# Patient Record
Sex: Male | Born: 1935 | Race: White | Hispanic: No | Marital: Married | Smoking: Former smoker
Health system: Southern US, Community
[De-identification: ages and names within clinical notes are randomized; demographics above are authoritative.]

## PROBLEM LIST (undated history)

## (undated) DIAGNOSIS — R1310 Dysphagia, unspecified: Secondary | ICD-10-CM

## (undated) DIAGNOSIS — J189 Pneumonia, unspecified organism: Secondary | ICD-10-CM

## (undated) DIAGNOSIS — R159 Full incontinence of feces: Secondary | ICD-10-CM

## (undated) DIAGNOSIS — E785 Hyperlipidemia, unspecified: Secondary | ICD-10-CM

## (undated) DIAGNOSIS — E119 Type 2 diabetes mellitus without complications: Secondary | ICD-10-CM

## (undated) DIAGNOSIS — K2270 Barrett's esophagus without dysplasia: Secondary | ICD-10-CM

## (undated) DIAGNOSIS — Z960 Presence of urogenital implants: Secondary | ICD-10-CM

## (undated) DIAGNOSIS — N189 Chronic kidney disease, unspecified: Secondary | ICD-10-CM

## (undated) DIAGNOSIS — Z978 Presence of other specified devices: Secondary | ICD-10-CM

## (undated) DIAGNOSIS — R339 Retention of urine, unspecified: Secondary | ICD-10-CM

## (undated) DIAGNOSIS — K219 Gastro-esophageal reflux disease without esophagitis: Secondary | ICD-10-CM

## (undated) DIAGNOSIS — I219 Acute myocardial infarction, unspecified: Secondary | ICD-10-CM

## (undated) DIAGNOSIS — I2510 Atherosclerotic heart disease of native coronary artery without angina pectoris: Secondary | ICD-10-CM

## (undated) HISTORY — PX: CORONARY ARTERY BYPASS GRAFT: SHX141

## (undated) HISTORY — DX: Hyperlipidemia, unspecified: E78.5

## (undated) HISTORY — PX: OTHER SURGICAL HISTORY: SHX169

## (undated) HISTORY — PX: BACK SURGERY: SHX140

## (undated) HISTORY — DX: Dysphagia, unspecified: R13.10

## (undated) HISTORY — DX: Barrett's esophagus without dysplasia: K22.70

## (undated) HISTORY — DX: Full incontinence of feces: R15.9

## (undated) HISTORY — PX: ROTATOR CUFF REPAIR: SHX139

---

## 1994-12-26 HISTORY — PX: CORONARY ARTERY BYPASS GRAFT: SHX141

## 2002-02-05 ENCOUNTER — Ambulatory Visit (HOSPITAL_COMMUNITY): Admission: RE | Admit: 2002-02-05 | Discharge: 2002-02-06 | Payer: Self-pay | Admitting: Internal Medicine

## 2002-02-06 ENCOUNTER — Encounter: Payer: Self-pay | Admitting: Internal Medicine

## 2005-10-18 ENCOUNTER — Ambulatory Visit (HOSPITAL_COMMUNITY): Admission: RE | Admit: 2005-10-18 | Discharge: 2005-10-18 | Payer: Self-pay | Admitting: Internal Medicine

## 2006-09-13 ENCOUNTER — Ambulatory Visit (HOSPITAL_COMMUNITY): Admission: RE | Admit: 2006-09-13 | Discharge: 2006-09-13 | Payer: Self-pay | Admitting: Neurosurgery

## 2006-10-19 ENCOUNTER — Inpatient Hospital Stay (HOSPITAL_COMMUNITY): Admission: RE | Admit: 2006-10-19 | Discharge: 2006-10-21 | Payer: Self-pay | Admitting: Neurosurgery

## 2009-04-02 ENCOUNTER — Encounter (HOSPITAL_COMMUNITY): Admission: RE | Admit: 2009-04-02 | Discharge: 2009-05-03 | Payer: Self-pay | Admitting: Internal Medicine

## 2010-11-29 ENCOUNTER — Ambulatory Visit (HOSPITAL_COMMUNITY)
Admission: RE | Admit: 2010-11-29 | Discharge: 2010-11-29 | Payer: Self-pay | Source: Home / Self Care | Admitting: Internal Medicine

## 2010-11-29 ENCOUNTER — Ambulatory Visit: Payer: Self-pay | Admitting: Internal Medicine

## 2011-05-10 NOTE — Procedures (Signed)
NAMEJERIKO, [name]             ACCOUNT NO.:  [id number]   MEDICAL RECORD NO.:  [id number]           PATIENT TYPE:  REC   LOCATION:  RAD                           FACILITY:  [facility]   PHYSICIAN:  Kingsley Callander. Ouida Sills, MD       DATE OF BIRTH:  July 05, 1934   DATE OF PROCEDURE:  DATE OF DISCHARGE:                                  STRESS TEST   [name] underwent a Myoview stress test for evaluation of chest  pain, has a history of prior bypass surgery.  He exercised 9 minutes  (completing stage III of the Bruce protocol) attaining a maximal heart  rate of 136 (93% of the age predicted maximal heart rate) and a workload  of 10.1 METS and discontinued exercise due to leg fatigue.  He had some  chest tightness at peak exercise in the early recovery.  He had PVCs and  bigeminal PVCs in early recovery.  He had one ventricular couplet and  late exercise.  His baseline His EKG interpretation therefore is limited  by his baseline right bundle-branch block.   IMPRESSION:  1. No definite evidence of exercise-induced ischemia, although      interpretation is limited by an underlying right bundle-branch      block.  Myoview images are pending.  2. Chest tightness with exercise.      Kingsley Callander. Ouida Sills, MD  Electronically Signed     ROF/MEDQ  D:  04/02/2009  T:  04/02/2009  Job:  [id number]

## 2011-05-13 NOTE — Op Note (Signed)
[name], [name]             ACCOUNT NO.:  [id number]   MEDICAL RECORD NO.:  [id number]          PATIENT TYPE:  INP   LOCATION:  3029                         FACILITY:  [facility]   PHYSICIAN:  Clydene Fake, M.D.  DATE OF BIRTH:  25-Apr-1934   DATE OF PROCEDURE:  10/19/2006  DATE OF DISCHARGE:                                 OPERATIVE REPORT   DIAGNOSIS:  Lumbar stenosis.   POSTOPERATIVE DIAGNOSIS:  Lumbar stenosis.   PROCEDURE:  Left L4-5 decompressive laminectomy and foraminotomies,  microdissection with the microscope.   SURGEON:  Clydene Fake, M.D.   ASSISTANT:  Coletta Memos, M.D.   ANESTHESIA:  General endotracheal tube anesthesia.   ESTIMATED BLOOD LOSS:  Minimal; blood given, none.   DRAINS:  None.   COMPLICATIONS:  None.   REASON FOR PROCEDURE:  The patient is a 77 year old gentleman, who has had  left leg pain and numbness.  He has lumbar stenosis, and there is a  possibility of a small far-lateral fragment compressing the nerve root.  The  patient consented for a decompression   PROCEDURE IN DETAIL:  The patient was brought to the operating room.  General anesthesia was induced.  The patient was placed in the prone  position on Wilson frame with all pressure points padded.  The patient the  wound prepped and draped in a sterile fashion.  The site of incision was  injected with 10 cc 1% lidocaine with epinephrine.  A needle was then placed  in the interspace.  X-ray was obtained, showing the needle was pointing at  the 5 spinous process.  Incision  was then made slightly above where the  needle was placed and the incision taken down to the fascia and hemostasis  obtained with Bovie cauterization.  The fascia was incised over the L4 and 5  spinous processes and subperiosteal dissection was done over the L4 and 5  spinous processes and laminae out to the facets.  Markers were placed at the  interspace.  X-rays were obtained, confirming our position at 4-5,  and self-  retaining retractors were placed.  The microscope was brought in for  microdissection at this point.  We proceeded with our procedure with  microdissection with the microscope.  A high-speed drill was used to start  the decompressive laminectomy on the left side, removing the 4 lamina,  medial facetectomy, bottom part of 5.  This was completed with the Kerrison  punches.  Ligamentum flavum was removed.  The Kerrison punches and curets  were then used to continue removing hypertrophic ligament cephalad and  caudal from our laminectomy opening, also marked a little bit on the  contralateral side, trying to scrape some ligament off, decompress the canal  and the lateral gutter on the left was explored and we did find hypertrophic  ligament and facet.  We drilled a little more laterally and used Kerrison  punches to extend the laminectomy and facetectomy a little bit more  laterally, opening up the canal better.  We explored the epidural space and  found to be a disk bulge, but no significant compression  or significant disk  herniation.  The 5 root was decompressed.  We began and started exploring  the 4 root and went just a little more lateral and did our decompression  here so we could see just above the disk space.  We used various size nerve  hooks and coronary dilators the open the disk space out just above the disk  space, and follow the 4 root out.  We could feel up to the pedicle of 4 and  the pedicle of 5, and get out.  We could not find any fragment.  We  considered doing an extraforaminal approach, but decided against it due to  our fairly lateral exposure, just from the medial side.  We could not find  any compression of the 4 roots.  Hemostasis was obtained with Gelfoam and  thrombin.  We irrigated that out.  We again checked the nerve roots.  They  were all well decompressed, along with the central canal and the 4 and 5  roots.  Exploring the dura, there was a small  weakened area of the dura,  though no CSF leak was seen.  No CSF was seen throughout the case.  It was  decided to coat this with Tisseel tissue glue, and we did this.  Retractors  were removed.  Following was closed with 0 Vicryl interrupted suture.  Subcutaneous tissue was closed with 0, 2-0 and 3-0 interrupted sutures.  The  skin was closed with benzoin and Steri-Strips.  A dressing was placed.  The  patient was placed back in supine position, awakened from anesthesia, and  returned to the recovery room in stable condition.           ______________________________  Clydene Fake, M.D.     JRH/MEDQ  D:  10/19/2006  T:  10/20/2006  Job:  [id number]

## 2011-05-13 NOTE — Procedures (Signed)
[facility]  Patient:    [name], HIPPE Visit Number: [id number] MRN: [id number]          Service Type: OUT Location: RAD Attending Physician:  Carylon Perches Dictated by:   Carylon Perches, M.D. Admit Date:  02/05/2002                                Stress Test  Mr. Caffrey exercised 10 minutes 15 seconds (1 minute 15 seconds into stage IV of the Bruce protocol) obtaining a maximal heart rate of 183 (119% of the age predicted maximal heart rate) at a work load of 12.9 METS and discontinued exercise due to fatigue.  There were no symptoms of chest pain.  There were ventricular and atrial premature complexes.  There were no ST segment changes diagnostic of ischemia.  EKG interpretation was limited due to a right bundle branch block.  His baseline EKG revealed normal sinus rhythm at 57 beats per minute with right bundle branch block pattern.  IMPRESSION:  No evidence of exercise induced ischemia, although EKG interpretation is limited due to his baseline EKG changes.  Cardiolite images are pending. Dictated by:   Carylon Perches, M.D. Attending Physician:  Carylon Perches DD:  02/05/02 TD:  02/05/02 Job: [id number]

## 2011-05-18 ENCOUNTER — Other Ambulatory Visit (HOSPITAL_COMMUNITY): Payer: Self-pay | Admitting: Internal Medicine

## 2011-05-18 DIAGNOSIS — J329 Chronic sinusitis, unspecified: Secondary | ICD-10-CM

## 2011-05-20 ENCOUNTER — Ambulatory Visit (HOSPITAL_COMMUNITY)
Admission: RE | Admit: 2011-05-20 | Discharge: 2011-05-20 | Disposition: A | Discharge status: Home / Self Care | Payer: Medicare Other | Source: Ambulatory Visit | Attending: Internal Medicine | Admitting: Internal Medicine

## 2011-05-20 DIAGNOSIS — J329 Chronic sinusitis, unspecified: Secondary | ICD-10-CM | POA: Insufficient documentation

## 2011-05-20 DIAGNOSIS — J342 Deviated nasal septum: Secondary | ICD-10-CM | POA: Insufficient documentation

## 2011-05-20 DIAGNOSIS — J3489 Other specified disorders of nose and nasal sinuses: Secondary | ICD-10-CM | POA: Insufficient documentation

## 2011-09-22 ENCOUNTER — Ambulatory Visit (INDEPENDENT_AMBULATORY_CARE_PROVIDER_SITE_OTHER): Payer: Medicare Other | Admitting: Otolaryngology

## 2011-09-22 DIAGNOSIS — J310 Chronic rhinitis: Secondary | ICD-10-CM

## 2011-09-22 DIAGNOSIS — J342 Deviated nasal septum: Secondary | ICD-10-CM

## 2011-09-27 ENCOUNTER — Ambulatory Visit (INDEPENDENT_AMBULATORY_CARE_PROVIDER_SITE_OTHER): Payer: Medicare Other | Admitting: Internal Medicine

## 2011-09-27 ENCOUNTER — Encounter (INDEPENDENT_AMBULATORY_CARE_PROVIDER_SITE_OTHER): Payer: Self-pay | Admitting: Internal Medicine

## 2011-09-27 ENCOUNTER — Encounter (INDEPENDENT_AMBULATORY_CARE_PROVIDER_SITE_OTHER): Payer: Self-pay

## 2011-09-27 VITALS — BP 104/60 | HR 60 | Temp 97.7°F | Ht 70.0 in | Wt 162.2 lb

## 2011-09-27 DIAGNOSIS — R159 Full incontinence of feces: Secondary | ICD-10-CM

## 2011-09-27 NOTE — Progress Notes (Signed)
Subjective:     Patient ID: [name], male   DOB: 22-Jul-1934, 77 y.o.   MRN: [id number]  HPI  [name] is a 77 yr old male presenting today with change in his bowel habits. He says when he has a bowel movement, he says he has to keep cleaning himself.  He says he has fecal leakage and doesn't even know it.  This will go for on for about a day.  Then he will go for a couple of days and not have a BM.  Then the cycle starts again.  He has not had any symptoms in a couple of days. He will have 2-3 stools a day with these episodes. Most of the time his stools are firm. This has been going on for a couple of years. He underwent a colonoscopy in June of 2011: Normal colonoscopy except for a few diverticula at the sigmoid colon.  He also underwent an EGD at the same time. Biopsy revealed Barrett's esophagus. No dysplasia or malignancy identified.  Review of Systems see hpi Current Outpatient Prescriptions  Medication Sig Dispense Refill  . aspirin 325 MG EC tablet Take 325 mg by mouth daily.        Marland Kitchen atenolol (TENORMIN) 25 MG tablet Take 25 mg by mouth daily.        Marland Kitchen atorvastatin (LIPITOR) 40 MG tablet Take 40 mg by mouth daily.        Marland Kitchen ezetimibe (ZETIA) 10 MG tablet Take 10 mg by mouth daily.        Marland Kitchen omeprazole (PRILOSEC) 20 MG capsule Take 20 mg by mouth daily.         Past Medical History  Diagnosis Date  . Hyperlipidemia    History   Social History Narrative  . No narrative on file   History   Social History  . Marital Status: Married    Spouse Name: N/A    Number of Children: N/A  . Years of Education: N/A   Occupational History  . Not on file.   Social History Main Topics  . Smoking status: Never Smoker   . Smokeless tobacco: Not on file  . Alcohol Use: No  . Drug Use: No  . Sexually Active: Not on file   Other Topics Concern  . Not on file   Social History Narrative  . No narrative on file   History reviewed. No pertinent family history. Family Status    Relation Status Death Age  . Mother Deceased     natural 43  . Father Deceased     MI  . Brother Alive     good health  . Child Alive     both in good health. He is married. Retired self employed   Allergies no known allergies      Objective:   Physical Exam Filed Vitals:   09/27/11 1408  BP: 104/60  Pulse: 60  Temp: 97.7 F (36.5 C)  Height: 5\' 10"  (1.778 m)  Weight: 162 lb 3.2 oz (73.573 kg)    Alert and oriented. Skin warm and dry. Oral mucosa is moist. Natural teeth in good condition. Sclera anicteric, conjunctivae is pink. Thyroid not enlarged. No cervical lymphadenopathy. Lungs clear. Heart regular rate and rhythm.  Abdomen is soft. Bowel sounds are positive. No hepatomegaly. No abdominal masses felt. No tenderness. Anal sphincter feels normal.  No edema to lower extremities. Patient is alert and oriented.      Assessment:    Change in  bowel movements.    I discussed this case with Dr. Karilyn Cota.  His colonoscopy was normal.  Plan:    Fiber 3-4 ms daily.   Will schedule a anorectal manometry at [facility].  OV 3 mos.  PR in 2 weeks.

## 2011-11-03 ENCOUNTER — Ambulatory Visit (INDEPENDENT_AMBULATORY_CARE_PROVIDER_SITE_OTHER): Payer: Medicare Other | Admitting: Otolaryngology

## 2011-11-03 DIAGNOSIS — J310 Chronic rhinitis: Secondary | ICD-10-CM

## 2012-02-13 ENCOUNTER — Ambulatory Visit (INDEPENDENT_AMBULATORY_CARE_PROVIDER_SITE_OTHER): Payer: Medicare Other | Admitting: Internal Medicine

## 2012-02-13 ENCOUNTER — Encounter (INDEPENDENT_AMBULATORY_CARE_PROVIDER_SITE_OTHER): Payer: Self-pay | Admitting: Internal Medicine

## 2012-02-13 VITALS — BP 110/64 | HR 70 | Temp 97.0°F | Ht 70.0 in | Wt 158.1 lb

## 2012-02-13 DIAGNOSIS — R159 Full incontinence of feces: Secondary | ICD-10-CM | POA: Insufficient documentation

## 2012-02-13 DIAGNOSIS — E785 Hyperlipidemia, unspecified: Secondary | ICD-10-CM

## 2012-02-13 DIAGNOSIS — I2510 Atherosclerotic heart disease of native coronary artery without angina pectoris: Secondary | ICD-10-CM

## 2012-02-13 NOTE — Patient Instructions (Signed)
Continue fiber supplements. OV as needed

## 2012-02-13 NOTE — Progress Notes (Signed)
Subjective:     Patient ID: [name], male   DOB: 02-06-1934, 78 y.o.   MRN: [id number]  [name] is a 78 yr old male here today for f/u.  He was last seen October for fecal incontinence.  He was referred to [facility] for a anorectal manometry which revealed no major problems on exam.  He tells me today he is doing good.  He says his BMs are better.  His bowel movements are firmer. He has a stool diary with him.  He is taking 4 gm of Fiber daily.  He says he feels better.  Appetite is good. No weight loss. No melena or bright red rectal bleeding.   Review of Systems see hpi     Current Outpatient Prescriptions  Medication Sig Dispense Refill  . aspirin 325 MG EC tablet Take 325 mg by mouth daily.        Marland Kitchen atenolol (TENORMIN) 25 MG tablet Take 25 mg by mouth daily.        Marland Kitchen atorvastatin (LIPITOR) 40 MG tablet Take 40 mg by mouth daily.        Marland Kitchen ezetimibe (ZETIA) 10 MG tablet Take 10 mg by mouth daily.        Marland Kitchen omeprazole (PRILOSEC) 20 MG capsule Take 20 mg by mouth daily.         Past Medical History  Diagnosis Date  . Hyperlipidemia   . Fecal incontinence      History reviewed, patient examined, no change in status, stable for surgery. History   Social History  . Marital Status: Married    Spouse Name: N/A    Number of Children: N/A  . Years of Education: N/A   Occupational History  . Not on file.   Social History Main Topics  . Smoking status: Never Smoker   . Smokeless tobacco: Not on file  . Alcohol Use: No  . Drug Use: No  . Sexually Active: Not on file   Other Topics Concern  . Not on file   Social History Narrative  . No narrative on file   Family Status  Relation Status Death Age  . Mother Deceased     natural 11  . Father Deceased     MI  . Brother Alive     good health  . Child Alive     both in good health. He is married. Retired self employed    Objective:   Physical Exam  Filed Vitals:   02/13/12 1036  Height: 5\' 10"  (1.778 m)    Weight: 158 lb 1.6 oz (71.714 kg)    Alert and oriented. Skin warm and dry. Oral mucosa is moist.   . Sclera anicteric, conjunctivae is pink. Thyroid not enlarged. No cervical lymphadenopathy. Lungs clear. Heart regular rate and rhythm.  Abdomen is soft. Bowel sounds are positive. No hepatomegaly. No abdominal masses felt. No tenderness.  No edema to lower extremities. Patient is alert and oriented.      Assessment:  Fecal Incontinence which has now resolved with Fiber intake.   Plan:    f/u prn basis.

## 2014-04-14 ENCOUNTER — Other Ambulatory Visit (HOSPITAL_COMMUNITY): Payer: Self-pay | Admitting: Internal Medicine

## 2014-04-14 ENCOUNTER — Ambulatory Visit (HOSPITAL_COMMUNITY)
Admission: RE | Admit: 2014-04-14 | Discharge: 2014-04-14 | Disposition: A | Discharge status: Home / Self Care | Payer: Medicare Other | Source: Ambulatory Visit | Attending: Internal Medicine | Admitting: Internal Medicine

## 2014-04-14 DIAGNOSIS — R059 Cough, unspecified: Secondary | ICD-10-CM | POA: Insufficient documentation

## 2014-04-14 DIAGNOSIS — J449 Chronic obstructive pulmonary disease, unspecified: Secondary | ICD-10-CM | POA: Insufficient documentation

## 2014-04-14 DIAGNOSIS — R509 Fever, unspecified: Secondary | ICD-10-CM | POA: Insufficient documentation

## 2014-04-14 DIAGNOSIS — J4489 Other specified chronic obstructive pulmonary disease: Secondary | ICD-10-CM | POA: Insufficient documentation

## 2014-04-14 DIAGNOSIS — R05 Cough: Secondary | ICD-10-CM

## 2014-04-14 DIAGNOSIS — R0989 Other specified symptoms and signs involving the circulatory and respiratory systems: Secondary | ICD-10-CM | POA: Insufficient documentation

## 2015-03-04 DIAGNOSIS — T1501XA Foreign body in cornea, right eye, initial encounter: Secondary | ICD-10-CM | POA: Diagnosis not present

## 2015-04-21 DIAGNOSIS — D1801 Hemangioma of skin and subcutaneous tissue: Secondary | ICD-10-CM | POA: Diagnosis not present

## 2015-04-21 DIAGNOSIS — L570 Actinic keratosis: Secondary | ICD-10-CM | POA: Diagnosis not present

## 2015-04-21 DIAGNOSIS — D485 Neoplasm of uncertain behavior of skin: Secondary | ICD-10-CM | POA: Diagnosis not present

## 2015-04-21 DIAGNOSIS — L821 Other seborrheic keratosis: Secondary | ICD-10-CM | POA: Diagnosis not present

## 2015-06-10 DIAGNOSIS — K219 Gastro-esophageal reflux disease without esophagitis: Secondary | ICD-10-CM | POA: Diagnosis not present

## 2015-06-10 DIAGNOSIS — I2510 Atherosclerotic heart disease of native coronary artery without angina pectoris: Secondary | ICD-10-CM | POA: Diagnosis not present

## 2015-06-10 DIAGNOSIS — E119 Type 2 diabetes mellitus without complications: Secondary | ICD-10-CM | POA: Diagnosis not present

## 2015-06-10 DIAGNOSIS — Z79899 Other long term (current) drug therapy: Secondary | ICD-10-CM | POA: Diagnosis not present

## 2015-06-10 DIAGNOSIS — E785 Hyperlipidemia, unspecified: Secondary | ICD-10-CM | POA: Diagnosis not present

## 2015-06-18 DIAGNOSIS — R7301 Impaired fasting glucose: Secondary | ICD-10-CM | POA: Diagnosis not present

## 2015-06-18 DIAGNOSIS — I2510 Atherosclerotic heart disease of native coronary artery without angina pectoris: Secondary | ICD-10-CM | POA: Diagnosis not present

## 2015-06-18 DIAGNOSIS — E785 Hyperlipidemia, unspecified: Secondary | ICD-10-CM | POA: Diagnosis not present

## 2015-10-02 DIAGNOSIS — Z6823 Body mass index (BMI) 23.0-23.9, adult: Secondary | ICD-10-CM | POA: Diagnosis not present

## 2015-10-02 DIAGNOSIS — R05 Cough: Secondary | ICD-10-CM | POA: Diagnosis not present

## 2015-10-15 DIAGNOSIS — Z23 Encounter for immunization: Secondary | ICD-10-CM | POA: Diagnosis not present

## 2015-12-01 ENCOUNTER — Other Ambulatory Visit (HOSPITAL_COMMUNITY): Payer: Self-pay | Admitting: Internal Medicine

## 2015-12-01 DIAGNOSIS — I70213 Atherosclerosis of native arteries of extremities with intermittent claudication, bilateral legs: Secondary | ICD-10-CM | POA: Diagnosis not present

## 2015-12-01 DIAGNOSIS — Z6824 Body mass index (BMI) 24.0-24.9, adult: Secondary | ICD-10-CM | POA: Diagnosis not present

## 2015-12-04 ENCOUNTER — Ambulatory Visit (HOSPITAL_COMMUNITY)
Admission: RE | Admit: 2015-12-04 | Discharge: 2015-12-04 | Disposition: A | Discharge status: Home / Self Care | Payer: Medicare Other | Source: Ambulatory Visit | Attending: Internal Medicine | Admitting: Internal Medicine

## 2015-12-04 DIAGNOSIS — N400 Enlarged prostate without lower urinary tract symptoms: Secondary | ICD-10-CM | POA: Diagnosis not present

## 2015-12-04 DIAGNOSIS — I708 Atherosclerosis of other arteries: Secondary | ICD-10-CM | POA: Diagnosis not present

## 2015-12-04 DIAGNOSIS — I70213 Atherosclerosis of native arteries of extremities with intermittent claudication, bilateral legs: Secondary | ICD-10-CM

## 2015-12-04 DIAGNOSIS — I77811 Abdominal aortic ectasia: Secondary | ICD-10-CM | POA: Insufficient documentation

## 2015-12-04 DIAGNOSIS — I700 Atherosclerosis of aorta: Secondary | ICD-10-CM | POA: Diagnosis not present

## 2015-12-11 DIAGNOSIS — E119 Type 2 diabetes mellitus without complications: Secondary | ICD-10-CM | POA: Diagnosis not present

## 2015-12-18 DIAGNOSIS — R7301 Impaired fasting glucose: Secondary | ICD-10-CM | POA: Diagnosis not present

## 2015-12-18 DIAGNOSIS — I700 Atherosclerosis of aorta: Secondary | ICD-10-CM | POA: Diagnosis not present

## 2016-01-07 DIAGNOSIS — H26492 Other secondary cataract, left eye: Secondary | ICD-10-CM | POA: Diagnosis not present

## 2016-01-07 DIAGNOSIS — E119 Type 2 diabetes mellitus without complications: Secondary | ICD-10-CM | POA: Diagnosis not present

## 2016-01-07 DIAGNOSIS — H52203 Unspecified astigmatism, bilateral: Secondary | ICD-10-CM | POA: Diagnosis not present

## 2016-01-07 DIAGNOSIS — Z961 Presence of intraocular lens: Secondary | ICD-10-CM | POA: Diagnosis not present

## 2016-06-13 DIAGNOSIS — I2510 Atherosclerotic heart disease of native coronary artery without angina pectoris: Secondary | ICD-10-CM | POA: Diagnosis not present

## 2016-06-13 DIAGNOSIS — K219 Gastro-esophageal reflux disease without esophagitis: Secondary | ICD-10-CM | POA: Diagnosis not present

## 2016-06-13 DIAGNOSIS — R7301 Impaired fasting glucose: Secondary | ICD-10-CM | POA: Diagnosis not present

## 2016-06-13 DIAGNOSIS — E785 Hyperlipidemia, unspecified: Secondary | ICD-10-CM | POA: Diagnosis not present

## 2016-06-13 DIAGNOSIS — Z79899 Other long term (current) drug therapy: Secondary | ICD-10-CM | POA: Diagnosis not present

## 2016-06-20 DIAGNOSIS — I2510 Atherosclerotic heart disease of native coronary artery without angina pectoris: Secondary | ICD-10-CM | POA: Diagnosis not present

## 2016-06-20 DIAGNOSIS — E785 Hyperlipidemia, unspecified: Secondary | ICD-10-CM | POA: Diagnosis not present

## 2016-06-20 DIAGNOSIS — E119 Type 2 diabetes mellitus without complications: Secondary | ICD-10-CM | POA: Diagnosis not present

## 2016-06-24 DIAGNOSIS — L570 Actinic keratosis: Secondary | ICD-10-CM | POA: Diagnosis not present

## 2016-06-24 DIAGNOSIS — L821 Other seborrheic keratosis: Secondary | ICD-10-CM | POA: Diagnosis not present

## 2016-06-24 DIAGNOSIS — L720 Epidermal cyst: Secondary | ICD-10-CM | POA: Diagnosis not present

## 2016-06-24 DIAGNOSIS — D692 Other nonthrombocytopenic purpura: Secondary | ICD-10-CM | POA: Diagnosis not present

## 2016-09-14 DIAGNOSIS — Z79899 Other long term (current) drug therapy: Secondary | ICD-10-CM | POA: Diagnosis not present

## 2016-09-14 DIAGNOSIS — E785 Hyperlipidemia, unspecified: Secondary | ICD-10-CM | POA: Diagnosis not present

## 2016-09-14 DIAGNOSIS — I2510 Atherosclerotic heart disease of native coronary artery without angina pectoris: Secondary | ICD-10-CM | POA: Diagnosis not present

## 2016-09-21 DIAGNOSIS — J069 Acute upper respiratory infection, unspecified: Secondary | ICD-10-CM | POA: Diagnosis not present

## 2016-09-21 DIAGNOSIS — E785 Hyperlipidemia, unspecified: Secondary | ICD-10-CM | POA: Diagnosis not present

## 2016-09-21 DIAGNOSIS — Z23 Encounter for immunization: Secondary | ICD-10-CM | POA: Diagnosis not present

## 2016-12-22 ENCOUNTER — Encounter (INDEPENDENT_AMBULATORY_CARE_PROVIDER_SITE_OTHER): Payer: Self-pay | Admitting: Internal Medicine

## 2016-12-22 ENCOUNTER — Encounter (INDEPENDENT_AMBULATORY_CARE_PROVIDER_SITE_OTHER): Payer: Self-pay

## 2017-01-09 DIAGNOSIS — H01001 Unspecified blepharitis right upper eyelid: Secondary | ICD-10-CM | POA: Diagnosis not present

## 2017-01-09 DIAGNOSIS — H16102 Unspecified superficial keratitis, left eye: Secondary | ICD-10-CM | POA: Diagnosis not present

## 2017-01-09 DIAGNOSIS — E119 Type 2 diabetes mellitus without complications: Secondary | ICD-10-CM | POA: Diagnosis not present

## 2017-01-09 DIAGNOSIS — H52203 Unspecified astigmatism, bilateral: Secondary | ICD-10-CM | POA: Diagnosis not present

## 2017-01-12 ENCOUNTER — Ambulatory Visit (INDEPENDENT_AMBULATORY_CARE_PROVIDER_SITE_OTHER): Payer: Medicare Other | Admitting: Internal Medicine

## 2017-01-23 DIAGNOSIS — Z79899 Other long term (current) drug therapy: Secondary | ICD-10-CM | POA: Diagnosis not present

## 2017-01-23 DIAGNOSIS — E119 Type 2 diabetes mellitus without complications: Secondary | ICD-10-CM | POA: Diagnosis not present

## 2017-01-25 ENCOUNTER — Encounter (INDEPENDENT_AMBULATORY_CARE_PROVIDER_SITE_OTHER): Payer: Self-pay | Admitting: *Deleted

## 2017-01-25 ENCOUNTER — Encounter (INDEPENDENT_AMBULATORY_CARE_PROVIDER_SITE_OTHER): Payer: Self-pay | Admitting: Internal Medicine

## 2017-01-25 ENCOUNTER — Other Ambulatory Visit (INDEPENDENT_AMBULATORY_CARE_PROVIDER_SITE_OTHER): Payer: Self-pay | Admitting: Internal Medicine

## 2017-01-25 ENCOUNTER — Ambulatory Visit (INDEPENDENT_AMBULATORY_CARE_PROVIDER_SITE_OTHER): Payer: Medicare HMO | Admitting: Internal Medicine

## 2017-01-25 VITALS — BP 124/70 | HR 60 | Temp 97.7°F | Ht 70.0 in | Wt 161.5 lb

## 2017-01-25 DIAGNOSIS — R1319 Other dysphagia: Secondary | ICD-10-CM

## 2017-01-25 DIAGNOSIS — K22710 Barrett's esophagus with low grade dysplasia: Secondary | ICD-10-CM | POA: Insufficient documentation

## 2017-01-25 DIAGNOSIS — R1310 Dysphagia, unspecified: Secondary | ICD-10-CM

## 2017-01-25 DIAGNOSIS — K2270 Barrett's esophagus without dysplasia: Secondary | ICD-10-CM

## 2017-01-25 HISTORY — DX: Dysphagia, unspecified: R13.10

## 2017-01-25 HISTORY — DX: Barrett's esophagus without dysplasia: K22.70

## 2017-01-25 NOTE — Patient Instructions (Signed)
EGD/ED. The risks and benefits such as perforation, bleeding, and infection were reviewed with the patient and is agreeable. 

## 2017-01-25 NOTE — Progress Notes (Signed)
   Subjective:    Patient ID: [name], male    DOB: [birth year], 83 y.o.   MRN: [id number]   HPI Presents today with c/o dysphagia. He says foods will not go down.  He cannot eat meat or breads. He can eat toast.  He says foods are lodging in his upper esophagus. Symptoms for a month or more. Acid reflux controlled with Prilosec. He has a BM daily. No melena or BRRB.    Last EGD/Colonosopy in 2011. A 4cm sized sliding hiatal hernia. Small patches of pink or salmon-colored mucosa which were biopsied. Normal exam of stomach,1st and 2nd part of duodenum. Normal colonoscopy except for 2 diverticula at sigmoid colon.  Biopsy: Barrett's esophagus with low grade dysplasia. No neoplasm identified.     CABG over 20 yrs ago.   Review of Systems  Past Medical History:  Diagnosis Date  . Barrett's esophagus 01/25/2017  . Dysphagia 01/25/2017  . Fecal incontinence   . Hyperlipidemia     Past Surgical History:  Procedure Laterality Date  . BACK SURGERY    . cataract surgery     bilateral  . CORONARY ARTERY BYPASS GRAFT     15 yrs ago  . ROTATOR CUFF REPAIR     Left    No Known Allergies  Current Outpatient Prescriptions on File Prior to Visit  Medication Sig Dispense Refill  . aspirin 325 MG EC tablet Take 325 mg by mouth daily.      Marland Kitchen atenolol (TENORMIN) 25 MG tablet Take 25 mg by mouth daily.      Marland Kitchen ezetimibe (ZETIA) 10 MG tablet Take 10 mg by mouth daily.      Marland Kitchen omeprazole (PRILOSEC) 20 MG capsule Take 20 mg by mouth daily.       No current facility-administered medications on file prior to visit.         Objective:   Physical Exam Blood pressure 124/70, pulse 60, temperature 97.7 F (36.5 C), height 5\' 10"  (1.778 m), weight 161 lb 8 oz (73.3 kg). Alert and oriented. Skin warm and dry. Oral mucosa is moist.   . Sclera anicteric, conjunctivae is pink. Thyroid not enlarged. No cervical lymphadenopathy. Lungs clear. Heart regular rate and rhythm.  Abdomen is soft.  Bowel sounds are positive. No hepatomegaly. No abdominal masses felt. No tenderness.  No edema to lower extremities.          Assessment & Plan:  Dysphagia. Hx of Barrett's esophagus. Needs surveillance for Barrett's. Esophageal stricture, web needs to be ruled out. EGD/ED.

## 2017-01-26 DIAGNOSIS — R69 Illness, unspecified: Secondary | ICD-10-CM | POA: Diagnosis not present

## 2017-01-31 ENCOUNTER — Encounter: Payer: Self-pay | Admitting: Internal Medicine

## 2017-01-31 DIAGNOSIS — E119 Type 2 diabetes mellitus without complications: Secondary | ICD-10-CM | POA: Diagnosis not present

## 2017-01-31 DIAGNOSIS — I2510 Atherosclerotic heart disease of native coronary artery without angina pectoris: Secondary | ICD-10-CM | POA: Diagnosis not present

## 2017-01-31 DIAGNOSIS — Z6823 Body mass index (BMI) 23.0-23.9, adult: Secondary | ICD-10-CM | POA: Diagnosis not present

## 2017-02-17 ENCOUNTER — Encounter (HOSPITAL_COMMUNITY): Payer: Self-pay | Admitting: *Deleted

## 2017-02-17 ENCOUNTER — Encounter (HOSPITAL_COMMUNITY)
Admission: RE | Disposition: A | Discharge status: Home / Self Care | Payer: Self-pay | Source: Ambulatory Visit | Attending: Internal Medicine

## 2017-02-17 ENCOUNTER — Ambulatory Visit (HOSPITAL_COMMUNITY)
Admission: RE | Admit: 2017-02-17 | Discharge: 2017-02-17 | Disposition: A | Discharge status: Home / Self Care | Payer: Medicare HMO | Source: Ambulatory Visit | Attending: Internal Medicine | Admitting: Internal Medicine

## 2017-02-17 DIAGNOSIS — R1314 Dysphagia, pharyngoesophageal phase: Secondary | ICD-10-CM | POA: Insufficient documentation

## 2017-02-17 DIAGNOSIS — Q394 Esophageal web: Secondary | ICD-10-CM | POA: Insufficient documentation

## 2017-02-17 DIAGNOSIS — R1319 Other dysphagia: Secondary | ICD-10-CM | POA: Insufficient documentation

## 2017-02-17 DIAGNOSIS — E785 Hyperlipidemia, unspecified: Secondary | ICD-10-CM | POA: Diagnosis not present

## 2017-02-17 DIAGNOSIS — R1310 Dysphagia, unspecified: Secondary | ICD-10-CM

## 2017-02-17 DIAGNOSIS — Z87891 Personal history of nicotine dependence: Secondary | ICD-10-CM | POA: Diagnosis not present

## 2017-02-17 DIAGNOSIS — K317 Polyp of stomach and duodenum: Secondary | ICD-10-CM | POA: Diagnosis not present

## 2017-02-17 DIAGNOSIS — Z7982 Long term (current) use of aspirin: Secondary | ICD-10-CM | POA: Diagnosis not present

## 2017-02-17 DIAGNOSIS — K222 Esophageal obstruction: Secondary | ICD-10-CM | POA: Insufficient documentation

## 2017-02-17 DIAGNOSIS — K2270 Barrett's esophagus without dysplasia: Secondary | ICD-10-CM | POA: Diagnosis not present

## 2017-02-17 DIAGNOSIS — Z79899 Other long term (current) drug therapy: Secondary | ICD-10-CM | POA: Diagnosis not present

## 2017-02-17 DIAGNOSIS — K219 Gastro-esophageal reflux disease without esophagitis: Secondary | ICD-10-CM | POA: Insufficient documentation

## 2017-02-17 DIAGNOSIS — K449 Diaphragmatic hernia without obstruction or gangrene: Secondary | ICD-10-CM | POA: Diagnosis not present

## 2017-02-17 DIAGNOSIS — K22710 Barrett's esophagus with low grade dysplasia: Secondary | ICD-10-CM | POA: Insufficient documentation

## 2017-02-17 HISTORY — PX: ESOPHAGOGASTRODUODENOSCOPY: SHX5428

## 2017-02-17 HISTORY — PX: ESOPHAGEAL DILATION: SHX303

## 2017-02-17 SURGERY — EGD (ESOPHAGOGASTRODUODENOSCOPY)
Anesthesia: Moderate Sedation

## 2017-02-17 MED ORDER — MEPERIDINE HCL 50 MG/ML IJ SOLN
INTRAMUSCULAR | Status: DC | PRN
  Administered 2017-02-17: 25 mg via INTRAVENOUS

## 2017-02-17 MED ORDER — STERILE WATER FOR IRRIGATION IR SOLN
Status: DC | PRN
  Administered 2017-02-17: 100 mL

## 2017-02-17 MED ORDER — SODIUM CHLORIDE 0.9 % IV SOLN
INTRAVENOUS | Status: DC
  Administered 2017-02-17: 1000 mL via INTRAVENOUS

## 2017-02-17 MED ORDER — MIDAZOLAM HCL 5 MG/5ML IJ SOLN
INTRAMUSCULAR | Status: DC | PRN
  Administered 2017-02-17: 1 mg via INTRAVENOUS
  Administered 2017-02-17: 2 mg via INTRAVENOUS
  Administered 2017-02-17: 1 mg via INTRAVENOUS

## 2017-02-17 MED ORDER — LIDOCAINE VISCOUS 2 % MT SOLN
OROMUCOSAL | Status: AC
  Filled 2017-02-17: qty 15

## 2017-02-17 MED ORDER — LIDOCAINE VISCOUS 2 % MT SOLN
OROMUCOSAL | Status: DC | PRN
  Administered 2017-02-17: 4 mL via OROMUCOSAL

## 2017-02-17 MED ORDER — MEPERIDINE HCL 50 MG/ML IJ SOLN
INTRAMUSCULAR | Status: AC
  Filled 2017-02-17: qty 1

## 2017-02-17 MED ORDER — MIDAZOLAM HCL 5 MG/5ML IJ SOLN
INTRAMUSCULAR | Status: AC
  Filled 2017-02-17: qty 10

## 2017-02-17 NOTE — Discharge Instructions (Signed)
No aspirin or NSAIDs for 3 days. Resume other medications as before. Soft foods for 24 hours. No driving for 24 hours. Patient will call with biopsy results.  Esophagogastroduodenoscopy, Care After Introduction Refer to this sheet in the next few weeks. These instructions provide you with information about caring for yourself after your procedure. Your health care provider may also give you more specific instructions. Your treatment has been planned according to current medical practices, but problems sometimes occur. Call your health care provider if you have any problems or questions after your procedure. What can I expect after the procedure? After the procedure, it is common to have:  A sore throat.  Nausea.  Bloating.  Dizziness.  Fatigue. Follow these instructions at home:  Do not eat or drink anything until the numbing medicine (local anesthetic) has worn off and your gag reflex has returned. You will know that the local anesthetic has worn off when you can swallow comfortably.  Do not drive for 24 hours if you received a medicine to help you relax (sedative).  If your health care provider took a tissue sample for testing during the procedure, make sure to get your test results. This is your responsibility. Ask your health care provider or the department performing the test when your results will be ready.  Keep all follow-up visits as told by your health care provider. This is important. Contact a health care provider if:  You cannot stop coughing.  You are not urinating.  You are urinating less than usual. Get help right away if:  You have trouble swallowing.  You cannot eat or drink.  You have throat or chest pain that gets worse.  You are dizzy or light-headed.  You faint.  You have nausea or vomiting.  You have chills.  You have a fever.  You have severe abdominal pain.  You have black, tarry, or bloody stools. This information is not intended to  replace advice given to you by your health care provider. Make sure you discuss any questions you have with your health care provider. Document Released: 11/28/2012 Document Revised: 05/19/2016 Document Reviewed: 11/05/2015  2017 Elsevier   Barrett Esophagus Introduction Barrett esophagus occurs when the tissue that lines the esophagus changes or becomes damaged. The esophagus is the tube that carries food from the throat to the stomach. With Barrett esophagus, the cells that line the esophagus are replaced by cells that are similar to the lining of the intestines (intestinal metaplasia). Barrett esophagus itself may not cause any symptoms. However, many people who have Barrett esophagus also have gastroesophageal reflux disease (GERD), which may cause symptoms such as heartburn. Treatment may include medicines, procedures to destroy the abnormal cells, or surgery. Over time, a few people with this condition may develop cancer of the esophagus. What are the causes? The exact cause of this condition is not known. In some cases, the condition develops from damage to the lining of the esophagus caused by GERD. GERD occurs when stomach acids flow up from the stomach into the esophagus. Frequent symptoms of GERD may cause intestinal metaplasia or cause cell changes (dysplasia). What increases the risk? The following factors may make you more likely to develop this condition:  Having GERD.  Being any of the following:  Male.  White (Caucasian).  Obese.  Older than 50.  Having a hiatal hernia.  Smoking. What are the signs or symptoms? People with Barrett esophagus often have no symptoms. However, many people with this condition also have  GERD. Symptoms of GERD may include:  Heartburn.  Difficulty swallowing.  Dry cough. How is this diagnosed? Barrett esophagus may be diagnosed with an exam called an upper gastrointestinal endoscopy. During this exam, a thin, flexible tube (endoscope)  is passed down your esophagus. The endoscope has a light and camera on the end of it. Your health care provider uses the endoscope to view the inside of your esophagus. During the exam, several tissue samples will be removed (biopsy) from your esophagus so they can be checked for intestinal metaplasia or dysplasia. How is this treated? Treatment for this condition may include:  Medicines (proton pump inhibitors, or PPIs) to decrease or stop GERD.  Periodic endoscopic exams to make sure that cancer is not developing.  A procedure or surgery for dysplasia. This may include:  Endoscopic removal or destruction of abnormal cells.  Removal of part of the esophagus (esophagectomy). Follow these instructions at home: Eating and drinking  Eat more fruits and vegetables.  Avoid fatty foods.  Eat small, frequent meals instead of large meals.  Avoid foods that cause heartburn. These foods include:  Coffee and alcoholic drinks.  Tomatoes and foods made with tomatoes.  Greasy or spicy foods.  Chocolate and peppermint. General instructions  Take over-the-counter and prescription medicines only as told by your health care provider.  Do not use any tobacco products, such as cigarettes, chewing tobacco, and e-cigarettes. If you need help quitting, ask your health care provider.  If your health care provider is treating you for GERD, make sure you follow all instructions and take medicines as directed.  Keep all follow-up visits as told by your health care provider. This is important. Contact a health care provider if:  You have heartburn or GERD symptoms.  You have difficulty swallowing. Get help right away if:  You have chest pain.  You are unable to swallow.  You vomit blood or material that looks like coffee grounds.  Your stool (feces) is bright red or dark. This information is not intended to replace advice given to you by your health care provider. Make sure you discuss any  questions you have with your health care provider. Document Released: 03/03/2004 Document Revised: 05/19/2016 Document Reviewed: 09/24/2015  2017 Elsevier   Hiatal Hernia A hiatal hernia occurs when part of your stomach slides above the muscle that separates your abdomen from your chest (diaphragm). You can be born with a hiatal hernia (congenital), or it may develop over time. In almost all cases of hiatal hernia, only the top part of the stomach pushes through.  Many people have a hiatal hernia with no symptoms. The larger the hernia, the more likely that you will have symptoms. In some cases, a hiatal hernia allows stomach acid to flow back into the tube that carries food from your mouth to your stomach (esophagus). This may cause heartburn symptoms. Severe heartburn symptoms may mean you have developed a condition called gastroesophageal reflux disease (GERD).  CAUSES  Hiatal hernias are caused by a weakness in the opening (hiatus) where your esophagus passes through your diaphragm to attach to the upper part of your stomach. You may be born with a weakness in your hiatus, or a weakness can develop. RISK FACTORS Older age is a major risk factor for a hiatal hernia. Anything that increases pressure on your diaphragm can also increase your risk of a hiatal hernia. This includes:  Pregnancy.  Excess weight.  Frequent constipation. SIGNS AND SYMPTOMS  People with a hiatal hernia  often have no symptoms. If symptoms develop, they are almost always caused by GERD. They may include:  Heartburn.  Belching.  Indigestion.  Trouble swallowing.  Coughing or wheezing.  Sore throat.  Hoarseness.  Chest pain. DIAGNOSIS  A hiatal hernia is sometimes found during an exam for another problem. Your health care provider may suspect a hiatal hernia if you have symptoms of GERD. Tests may be done to diagnose GERD. These may include:  X-rays of your stomach or chest.  An upper gastrointestinal  (GI) series. This is an X-ray exam of your GI tract involving the use of a chalky liquid that you swallow. The liquid shows up clearly on the X-ray.  Endoscopy. This is a procedure to look into your stomach using a thin, flexible tube that has a tiny camera and light on the end of it. TREATMENT  If you have no symptoms, you may not need treatment. If you have symptoms, treatment may include:  Dietary and lifestyle changes to help reduce GERD symptoms.  Medicines. These may include:  Over-the-counter antacids.  Medicines that make your stomach empty more quickly.  Medicines that block the production of stomach acid (H2 blockers).  Stronger medicines to reduce stomach acid (proton pump inhibitors).  You may need surgery to repair the hernia if other treatments are not helping. HOME CARE INSTRUCTIONS   Take all medicines as directed by your health care provider.  Quit smoking, if you smoke.  Try to achieve and maintain a healthy body weight.  Eat frequent small meals instead of three large meals a day. This keeps your stomach from getting too full.  Eat slowly.  Do not lie down right after eating.  Do noteat 1-2 hours before bed.   Do not drink beverages with caffeine. These include cola, coffee, cocoa, and tea.  Do not drink alcohol.  Avoid foods that can make symptoms of GERD worse. These may include:  Fatty foods.  Citrus fruits.  Other foods and drinks that contain acid.  Avoid putting pressure on your belly. Anything that puts pressure on your belly increases the amount of acid that may be pushed up into your esophagus.   Avoid bending over, especially after eating.  Raise the head of your bed by putting blocks under the legs. This keeps your head and esophagus higher than your stomach.  Do not wear tight clothing around your chest or stomach.  Try not to strain when having a bowel movement, when urinating, or when lifting heavy objects. SEEK MEDICAL CARE  IF:  Your symptoms are not controlled with medicines or lifestyle changes.  You are having trouble swallowing.  You have coughing or wheezing that will not go away. SEEK IMMEDIATE MEDICAL CARE IF:  Your pain is getting worse.  Your pain spreads to your arms, neck, jaw, teeth, or back.  You have shortness of breath.  You sweat for no reason.  You feel sick to your stomach (nauseous) or vomit.  You vomit blood.  You have bright red blood in your stools.  You have black, tarry stools.  This information is not intended to replace advice given to you by your health care provider. Make sure you discuss any questions you have with your health care provider. Document Released: 03/03/2004 Document Revised: 04/04/2016 Document Reviewed: 11/29/2013 Elsevier Interactive Patient Education  2017 [employer].

## 2017-02-17 NOTE — H&P (Signed)
[name] is an 83 y.o. male.   Chief Complaint: Patient is here for EGD and ED. HPI: Patient is 83 year old Caucasian male was chronic GERD complicated by short segment Barrett's esophagus was last EGD with biopsy was in December 2011 who presented to month history of dysphagia to solids. He has no difficulty with liquids. He points to suprasternal area site of bolus obstruction. He is able to wash food bolus down with sips of water. He says heartburn is well controlled with omeprazole. He denies nausea vomiting abdominal pain or melena. His weight has been stable.  Past Medical History:  Diagnosis Date  . Barrett's esophagus 01/25/2017  . Dysphagia 01/25/2017  . Fecal incontinence   . Hyperlipidemia     Past Surgical History:  Procedure Laterality Date  . BACK SURGERY    . cataract surgery     bilateral  . CORONARY ARTERY BYPASS GRAFT     15 yrs ago  . ROTATOR CUFF REPAIR     Left    History reviewed. No pertinent family history. Social History:  reports that he has quit smoking. He has quit using smokeless tobacco. He reports that he does not drink alcohol or use drugs.  Allergies: No Known Allergies  Medications Prior to Admission  Medication Sig Dispense Refill  . aspirin 325 MG EC tablet Take 325 mg by mouth daily.      Marland Kitchen atenolol (TENORMIN) 25 MG tablet Take 25 mg by mouth daily.      Marland Kitchen ezetimibe (ZETIA) 10 MG tablet Take 10 mg by mouth daily.      Marland Kitchen omeprazole (PRILOSEC) 20 MG capsule Take 20 mg by mouth daily.      . polycarbophil (FIBERCON) 625 MG tablet Take 1,250 mg by mouth 2 (two) times daily.     . pravastatin (PRAVACHOL) 20 MG tablet Take 20 mg by mouth at bedtime.     Marland Kitchen acetaminophen (TYLENOL) 500 MG tablet Take 500-1,000 mg by mouth daily as needed for moderate pain or headache.      No results found for this or any previous visit (from the past 48 hour(s)). No results found.  ROS  Blood pressure (!) 151/75, pulse (!) 51, temperature 97.5 F (36.4  C), temperature source Oral, resp. rate 18, height 5\' 9"  (1.753 m), weight 160 lb (72.6 kg), SpO2 99 %. Physical Exam  Constitutional: He appears well-developed and well-nourished.  HENT:  Mouth/Throat: Oropharynx is clear and moist.  Eyes: Conjunctivae are normal. No scleral icterus.  Neck: No thyromegaly present.  Cardiovascular: Normal rate, regular rhythm and normal heart sounds.   No murmur heard. Respiratory: Effort normal and breath sounds normal.  GI: Soft. He exhibits no distension and no mass. There is no tenderness.  Musculoskeletal: He exhibits no edema.  Lymphadenopathy:    He has no cervical adenopathy.  Neurological: He is alert.  Skin: Skin is warm and dry.     Assessment/Plan Solid food dysphagia in patient with chronic GERD and known short segment Barrett's esophagus. EGD with EGD and possible biopsy.  Hildred Laser, MD 02/17/2017, 12:30 PM

## 2017-02-17 NOTE — Op Note (Signed)
[facility] Patient Name: [name] Procedure Date: 02/17/2017 12:09 PM MRN: [id number] Date of Birth: [birth year] Attending MD: Hildred Laser , MD CSN: [id number] Age: 83 Admit Type: Outpatient Procedure:                Upper GI endoscopy Indications:              Esophageal dysphagia Providers:                Hildred Laser, MD, Rosina Lowenstein, RN, Aram Candela Referring MD:             Asencion Noble, MD Medicines:                viscous lidocaine , Meperidine 25 mg IV, Midazolam                            4 mg IV Complications:            No immediate complications. Estimated Blood Loss:     Estimated blood loss was minimal. Procedure:                Pre-Anesthesia Assessment:                           - Prior to the procedure, a History and Physical                            was performed, and patient medications and                            allergies were reviewed. The patient's tolerance of                            previous anesthesia was also reviewed. The risks                            and benefits of the procedure and the sedation                            options and risks were discussed with the patient.                            All questions were answered, and informed consent                            was obtained. Prior Anticoagulants: The patient                            last took aspirin 4 days prior to the procedure.                            ASA Grade Assessment: II - A patient with mild                            systemic disease. After reviewing the risks and  benefits, the patient was deemed in satisfactory                            condition to undergo the procedure.                           After obtaining informed consent, the endoscope was                            passed under direct vision. Throughout the                            procedure, the patient's blood pressure, pulse, and   oxygen saturations were monitored continuously. The                            [id number]) was introduced through the                            mouth, and advanced to the second part of duodenum.                            The upper GI endoscopy was accomplished without                            difficulty. The patient tolerated the procedure                            well. Scope In: 12:37:23 PM Scope Out: 12:47:33 PM Total Procedure Duration: 0 hours 10 minutes 10 seconds  Findings:      A web was found in the proximal esophagus.      A low-grade of narrowing Schatzki ring (acquired) was found at the       gastroesophageal junction. The scope was withdrawn. Dilation was       performed with a Maloney dilator with no resistance at 40 Fr. The       dilation site was examined following endoscope reinsertion and showed       moderate improvement in luminal narrowing.      There were esophageal mucosal changes secondary to established       short-segment Barrett's disease present in the distal esophagus. The       maximum longitudinal extent of these mucosal changes was 0.5 cm in       length. Mucosa was biopsied with a cold forceps for histology at 35 cm       from the incisors.      A 6 cm hiatal hernia was present.      A few sessile polyps were found in the gastric body.      The exam of the stomach was otherwise normal.      The duodenal bulb and second portion of the duodenum were normal. Impression:               - Web in the proximal esophagus.                           - Low-grade of narrowing Schatzki ring. Dilated.                           -  Esophageal mucosal changes secondary to                            established short-segment Barrett's disease.                            Biopsied.                           - 6 cm hiatal hernia.                           - A few gastric polyps.                           - Normal duodenal bulb and second portion of the                             duodenum. Moderate Sedation:      Moderate (conscious) sedation was administered by the endoscopy nurse       and supervised by the endoscopist. The following parameters were       monitored: oxygen saturation, heart rate, blood pressure, CO2       capnography and response to care. Total physician intraservice time was       14 minutes. Recommendation:           - Patient has a contact number available for                            emergencies. The signs and symptoms of potential                            delayed complications were discussed with the                            patient. Return to normal activities tomorrow.                            Written discharge instructions were provided to the                            patient.                           - Mechanical soft diet today.                           - Resume previous diet for 1 day.                           - Continue present medications.                           - Resume aspirin at prior dose in 3 days.                           - Await pathology results. Procedure  Code(s):        --- Professional ---                           [phone], Esophagogastroduodenoscopy, flexible,                            transoral; with biopsy, single or multiple                           43450, Dilation of esophagus, by unguided sound or                            bougie, single or multiple passes                           99152, Moderate sedation services provided by the                            same physician or other qualified health care                            professional performing the diagnostic or                            therapeutic service that the sedation supports,                            requiring the presence of an independent trained                            observer to assist in the monitoring of the                            patient's level of consciousness and physiological                             status; initial 15 minutes of intraservice time,                            patient age 72 years or older Diagnosis Code(s):        --- Professional ---                           Q39.4, Esophageal web                           K22.2, Esophageal obstruction                           K22.70, Barrett's esophagus without dysplasia                           K44.9, Diaphragmatic hernia without obstruction or  gangrene                           K31.7, Polyp of stomach and duodenum                           R13.14, Dysphagia, pharyngoesophageal phase CPT copyright 2016 American Medical Association. All rights reserved. The codes documented in this report are preliminary and upon coder review may  be revised to meet current compliance requirements. Hildred Laser, MD Hildred Laser, MD 02/17/2017 12:57:41 PM This report has been signed electronically. Number of Addenda: 0

## 2017-02-21 ENCOUNTER — Encounter (HOSPITAL_COMMUNITY): Payer: Self-pay | Admitting: Internal Medicine

## 2017-03-01 DIAGNOSIS — R69 Illness, unspecified: Secondary | ICD-10-CM | POA: Diagnosis not present

## 2017-04-24 DIAGNOSIS — R05 Cough: Secondary | ICD-10-CM | POA: Diagnosis not present

## 2017-06-06 DIAGNOSIS — J40 Bronchitis, not specified as acute or chronic: Secondary | ICD-10-CM | POA: Diagnosis not present

## 2017-07-28 DIAGNOSIS — E785 Hyperlipidemia, unspecified: Secondary | ICD-10-CM | POA: Diagnosis not present

## 2017-07-28 DIAGNOSIS — I2510 Atherosclerotic heart disease of native coronary artery without angina pectoris: Secondary | ICD-10-CM | POA: Diagnosis not present

## 2017-07-28 DIAGNOSIS — K219 Gastro-esophageal reflux disease without esophagitis: Secondary | ICD-10-CM | POA: Diagnosis not present

## 2017-07-28 DIAGNOSIS — E119 Type 2 diabetes mellitus without complications: Secondary | ICD-10-CM | POA: Diagnosis not present

## 2017-07-28 DIAGNOSIS — Z79899 Other long term (current) drug therapy: Secondary | ICD-10-CM | POA: Diagnosis not present

## 2017-08-07 DIAGNOSIS — E119 Type 2 diabetes mellitus without complications: Secondary | ICD-10-CM | POA: Diagnosis not present

## 2017-08-07 DIAGNOSIS — E785 Hyperlipidemia, unspecified: Secondary | ICD-10-CM | POA: Diagnosis not present

## 2017-09-18 DIAGNOSIS — L570 Actinic keratosis: Secondary | ICD-10-CM | POA: Diagnosis not present

## 2017-09-18 DIAGNOSIS — L821 Other seborrheic keratosis: Secondary | ICD-10-CM | POA: Diagnosis not present

## 2017-09-18 DIAGNOSIS — D1801 Hemangioma of skin and subcutaneous tissue: Secondary | ICD-10-CM | POA: Diagnosis not present

## 2017-09-18 DIAGNOSIS — D225 Melanocytic nevi of trunk: Secondary | ICD-10-CM | POA: Diagnosis not present

## 2017-09-18 DIAGNOSIS — L905 Scar conditions and fibrosis of skin: Secondary | ICD-10-CM | POA: Diagnosis not present

## 2017-10-05 DIAGNOSIS — Z23 Encounter for immunization: Secondary | ICD-10-CM | POA: Diagnosis not present

## 2018-01-11 DIAGNOSIS — H52203 Unspecified astigmatism, bilateral: Secondary | ICD-10-CM | POA: Diagnosis not present

## 2018-01-11 DIAGNOSIS — H02105 Unspecified ectropion of left lower eyelid: Secondary | ICD-10-CM | POA: Diagnosis not present

## 2018-01-11 DIAGNOSIS — H0100A Unspecified blepharitis right eye, upper and lower eyelids: Secondary | ICD-10-CM | POA: Diagnosis not present

## 2018-01-11 DIAGNOSIS — E119 Type 2 diabetes mellitus without complications: Secondary | ICD-10-CM | POA: Diagnosis not present

## 2018-01-18 DIAGNOSIS — Z0101 Encounter for examination of eyes and vision with abnormal findings: Secondary | ICD-10-CM | POA: Diagnosis not present

## 2018-01-26 DIAGNOSIS — R69 Illness, unspecified: Secondary | ICD-10-CM | POA: Diagnosis not present

## 2018-01-31 DIAGNOSIS — E119 Type 2 diabetes mellitus without complications: Secondary | ICD-10-CM | POA: Diagnosis not present

## 2018-02-02 DIAGNOSIS — R69 Illness, unspecified: Secondary | ICD-10-CM | POA: Diagnosis not present

## 2018-02-07 DIAGNOSIS — I2510 Atherosclerotic heart disease of native coronary artery without angina pectoris: Secondary | ICD-10-CM | POA: Diagnosis not present

## 2018-02-07 DIAGNOSIS — Z6823 Body mass index (BMI) 23.0-23.9, adult: Secondary | ICD-10-CM | POA: Diagnosis not present

## 2018-02-07 DIAGNOSIS — E785 Hyperlipidemia, unspecified: Secondary | ICD-10-CM | POA: Diagnosis not present

## 2018-02-07 DIAGNOSIS — E119 Type 2 diabetes mellitus without complications: Secondary | ICD-10-CM | POA: Diagnosis not present

## 2018-02-19 ENCOUNTER — Ambulatory Visit (INDEPENDENT_AMBULATORY_CARE_PROVIDER_SITE_OTHER): Payer: Medicare HMO | Admitting: Internal Medicine

## 2018-02-19 ENCOUNTER — Encounter (INDEPENDENT_AMBULATORY_CARE_PROVIDER_SITE_OTHER): Payer: Self-pay | Admitting: Internal Medicine

## 2018-02-19 VITALS — BP 130/80 | HR 60 | Temp 97.7°F | Ht 70.0 in | Wt 159.3 lb

## 2018-02-19 DIAGNOSIS — R1319 Other dysphagia: Secondary | ICD-10-CM

## 2018-02-19 DIAGNOSIS — K219 Gastro-esophageal reflux disease without esophagitis: Secondary | ICD-10-CM | POA: Diagnosis not present

## 2018-02-19 DIAGNOSIS — R1310 Dysphagia, unspecified: Secondary | ICD-10-CM

## 2018-02-19 NOTE — Progress Notes (Signed)
Subjective:    Patient ID: [name], male    DOB: February 07, 1934, 84 y.o.   MRN: [id number]  HPIHere today for f/u. Last seen in January of 2018. Hx of Barrett's esophagus. Underwent an EGD in February of 2018 for hx of Barrett's. He tells me he is good. Acid reflux is controlled with Omeprazole. No dysphagia. He can eat anything he wants.  BMS are normal.   Retired Building control surveyor.  EGD 02/17/2017 Impression:               - Web in the proximal esophagus.                           - Low-grade of narrowing Schatzki ring. Dilated.                           - Esophageal mucosal changes secondary to                            established short-segment Barrett's disease.                            Biopsied.                           - 6 cm hiatal hernia.                           - A few gastric polyps.                           - Normal duodenal bulb and second portion of the                             duodenum.  Esophageal biopsy shows changes of reflux esophagitis but no Barrett's.    EGD/Colonosopy in 2011. A 4cm sized sliding hiatal hernia. Small patches of pink or salmon-colored mucosa which were biopsied. Normal exam of stomach,1st and 2nd part of duodenum. Normal colonoscopy except for 2 diverticula at sigmoid colon.  Biopsy: Barrett's esophagus with low grade dysplasia. No neoplasm identified.     CABG over 20 yrs ago.   Review of Systems Past Medical History:  Diagnosis Date  . Barrett's esophagus 01/25/2017  . Dysphagia 01/25/2017  . Fecal incontinence   . Hyperlipidemia     Past Surgical History:  Procedure Laterality Date  . BACK SURGERY    . cataract surgery     bilateral  . CORONARY ARTERY BYPASS GRAFT     15 yrs ago  . ESOPHAGEAL DILATION N/A 02/17/2017   Procedure: ESOPHAGEAL DILATION;  Surgeon: Rogene Houston, MD;  Location: [facility];  Service: Endoscopy;  Laterality: N/A;  . ESOPHAGOGASTRODUODENOSCOPY N/A 02/17/2017   Procedure:  ESOPHAGOGASTRODUODENOSCOPY (EGD);  Surgeon: Rogene Houston, MD;  Location: [facility];  Service: Endoscopy;  Laterality: N/A;  12:45  . ROTATOR CUFF REPAIR     Left    No Known Allergies  Current Outpatient Medications on File Prior to Visit  Medication Sig Dispense Refill  . acetaminophen (TYLENOL) 500 MG tablet Take 500-1,000 mg by mouth daily as needed for moderate pain or headache.    Marland Kitchen  aspirin 325 MG EC tablet Take 1 tablet (325 mg total) by mouth daily.    Marland Kitchen atenolol (TENORMIN) 25 MG tablet Take 25 mg by mouth daily.      Marland Kitchen ezetimibe (ZETIA) 10 MG tablet Take 10 mg by mouth daily.      Marland Kitchen omeprazole (PRILOSEC) 20 MG capsule Take 20 mg by mouth daily.      . polycarbophil (FIBERCON) 625 MG tablet Take 1,250 mg by mouth 2 (two) times daily.     . pravastatin (PRAVACHOL) 20 MG tablet Take 20 mg by mouth. Take three times a week at bedtime     No current facility-administered medications on file prior to visit.         Objective:   Physical Exam Blood pressure 130/80, pulse 60, temperature 97.7 F (36.5 C), height 5\' 10"  (1.778 m), weight 159 lb 4.8 oz (72.3 kg). Alert and oriented. Skin warm and dry. Oral mucosa is moist.   . Sclera anicteric, conjunctivae is pink. Thyroid not enlarged. No cervical lymphadenopathy. Lungs clear. Heart regular rate and rhythm.  Abdomen is soft. Bowel sounds are positive. No hepatomegaly. No abdominal masses felt. No tenderness.  No edema to lower extremities.          Assessment & Plan:  GERD controlled with Omeprazole. No problems with dysphagia.  OV in 1 year

## 2018-02-19 NOTE — Patient Instructions (Signed)
OV in 1 year.  

## 2018-03-21 ENCOUNTER — Ambulatory Visit (INDEPENDENT_AMBULATORY_CARE_PROVIDER_SITE_OTHER): Payer: Medicare HMO | Admitting: Internal Medicine

## 2018-06-18 ENCOUNTER — Ambulatory Visit (HOSPITAL_COMMUNITY)
Admission: RE | Admit: 2018-06-18 | Discharge: 2018-06-18 | Disposition: A | Discharge status: Home / Self Care | Payer: Medicare HMO | Source: Ambulatory Visit | Attending: Internal Medicine | Admitting: Internal Medicine

## 2018-06-18 ENCOUNTER — Other Ambulatory Visit (HOSPITAL_COMMUNITY): Payer: Self-pay | Admitting: Internal Medicine

## 2018-06-18 DIAGNOSIS — R0781 Pleurodynia: Secondary | ICD-10-CM | POA: Insufficient documentation

## 2018-06-18 DIAGNOSIS — R079 Chest pain, unspecified: Secondary | ICD-10-CM | POA: Diagnosis not present

## 2018-08-08 DIAGNOSIS — E119 Type 2 diabetes mellitus without complications: Secondary | ICD-10-CM | POA: Diagnosis not present

## 2018-08-08 DIAGNOSIS — Z79899 Other long term (current) drug therapy: Secondary | ICD-10-CM | POA: Diagnosis not present

## 2018-08-08 DIAGNOSIS — I2510 Atherosclerotic heart disease of native coronary artery without angina pectoris: Secondary | ICD-10-CM | POA: Diagnosis not present

## 2018-08-08 DIAGNOSIS — E785 Hyperlipidemia, unspecified: Secondary | ICD-10-CM | POA: Diagnosis not present

## 2018-08-14 DIAGNOSIS — R69 Illness, unspecified: Secondary | ICD-10-CM | POA: Diagnosis not present

## 2018-08-15 DIAGNOSIS — E1169 Type 2 diabetes mellitus with other specified complication: Secondary | ICD-10-CM | POA: Diagnosis not present

## 2018-08-15 DIAGNOSIS — I25810 Atherosclerosis of coronary artery bypass graft(s) without angina pectoris: Secondary | ICD-10-CM | POA: Diagnosis not present

## 2018-08-15 DIAGNOSIS — I700 Atherosclerosis of aorta: Secondary | ICD-10-CM | POA: Diagnosis not present

## 2018-08-15 DIAGNOSIS — E785 Hyperlipidemia, unspecified: Secondary | ICD-10-CM | POA: Diagnosis not present

## 2018-09-10 DIAGNOSIS — J069 Acute upper respiratory infection, unspecified: Secondary | ICD-10-CM | POA: Diagnosis not present

## 2018-10-08 DIAGNOSIS — Z23 Encounter for immunization: Secondary | ICD-10-CM | POA: Diagnosis not present

## 2018-10-22 DIAGNOSIS — L821 Other seborrheic keratosis: Secondary | ICD-10-CM | POA: Diagnosis not present

## 2018-10-22 DIAGNOSIS — L570 Actinic keratosis: Secondary | ICD-10-CM | POA: Diagnosis not present

## 2018-10-22 DIAGNOSIS — D225 Melanocytic nevi of trunk: Secondary | ICD-10-CM | POA: Diagnosis not present

## 2018-10-22 DIAGNOSIS — D1801 Hemangioma of skin and subcutaneous tissue: Secondary | ICD-10-CM | POA: Diagnosis not present

## 2018-11-12 DIAGNOSIS — R69 Illness, unspecified: Secondary | ICD-10-CM | POA: Diagnosis not present

## 2018-12-06 ENCOUNTER — Encounter (HOSPITAL_COMMUNITY): Payer: Self-pay | Admitting: Emergency Medicine

## 2018-12-06 ENCOUNTER — Emergency Department (HOSPITAL_COMMUNITY)
Admission: EM | Admit: 2018-12-06 | Discharge: 2018-12-06 | Disposition: A | Discharge status: Home / Self Care | Payer: Medicare HMO | Attending: Emergency Medicine | Admitting: Emergency Medicine

## 2018-12-06 ENCOUNTER — Other Ambulatory Visit: Payer: Self-pay

## 2018-12-06 DIAGNOSIS — Z79899 Other long term (current) drug therapy: Secondary | ICD-10-CM | POA: Diagnosis not present

## 2018-12-06 DIAGNOSIS — Z7982 Long term (current) use of aspirin: Secondary | ICD-10-CM | POA: Insufficient documentation

## 2018-12-06 DIAGNOSIS — Z87891 Personal history of nicotine dependence: Secondary | ICD-10-CM | POA: Diagnosis not present

## 2018-12-06 DIAGNOSIS — R338 Other retention of urine: Secondary | ICD-10-CM

## 2018-12-06 DIAGNOSIS — I2510 Atherosclerotic heart disease of native coronary artery without angina pectoris: Secondary | ICD-10-CM | POA: Diagnosis not present

## 2018-12-06 DIAGNOSIS — N401 Enlarged prostate with lower urinary tract symptoms: Secondary | ICD-10-CM | POA: Insufficient documentation

## 2018-12-06 DIAGNOSIS — R1084 Generalized abdominal pain: Secondary | ICD-10-CM | POA: Diagnosis present

## 2018-12-06 DIAGNOSIS — R339 Retention of urine, unspecified: Secondary | ICD-10-CM | POA: Insufficient documentation

## 2018-12-06 DIAGNOSIS — N320 Bladder-neck obstruction: Secondary | ICD-10-CM | POA: Diagnosis not present

## 2018-12-06 DIAGNOSIS — R350 Frequency of micturition: Secondary | ICD-10-CM

## 2018-12-06 LAB — URINALYSIS, ROUTINE W REFLEX MICROSCOPIC
Bilirubin Urine: NEGATIVE
GLUCOSE, UA: NEGATIVE mg/dL
HGB URINE DIPSTICK: NEGATIVE
KETONES UR: NEGATIVE mg/dL
LEUKOCYTES UA: NEGATIVE
Nitrite: NEGATIVE
PH: 5.5 (ref 5.0–8.0)
PROTEIN: NEGATIVE mg/dL
Specific Gravity, Urine: 1.01 (ref 1.005–1.030)

## 2018-12-06 MED ORDER — TAMSULOSIN HCL 0.4 MG PO CAPS
0.4000 mg | ORAL_CAPSULE | Freq: Once | ORAL | Status: AC
  Administered 2018-12-06: 0.4 mg via ORAL

## 2018-12-06 MED ORDER — TAMSULOSIN HCL 0.4 MG PO CAPS
ORAL_CAPSULE | ORAL | Status: AC
  Filled 2018-12-06: qty 1

## 2018-12-06 MED ORDER — TAMSULOSIN HCL 0.4 MG PO CAPS: 0.4000 mg | ORAL_CAPSULE | Freq: Every day | ORAL | 0 refills | Status: DC

## 2018-12-06 NOTE — ED Notes (Signed)
Leg bag placed on pt. Instructed pt and wife how to change from leg bag to Foley. Verbalized understanding.

## 2018-12-06 NOTE — ED Notes (Signed)
EDP at bedside  

## 2018-12-06 NOTE — ED Provider Notes (Signed)
[facility] EMERGENCY DEPARTMENT Provider Note   CSN: [id number] Arrival date & time: 12/06/18  1221     History   Chief Complaint Chief Complaint  Patient presents with  . Abdominal Pain    HPI [name] is a 84 y.o. male.  Patient presenting with several days of worsening abdominal pain and urinary frequency.  Patient says that he has to get up in the night multiple times to attempt to urinate but is unable to get it to the point where he feels like he is able to empty his bladder.  Patient has history of BPH and is not currently on any medication like Flomax due to side effects, however he does not wish to fix that first that he is stopped it.  Patient denies any fevers or chills, nausea, not vomiting, diarrhea, constipation.  Patient was seen by PCP today sent to the emergency room for concern for urinary retention.  Bladder scan revealed greater than 1000 mL.     Past Medical History:  Diagnosis Date  . Barrett's esophagus 01/25/2017  . Dysphagia 01/25/2017  . Fecal incontinence   . Hyperlipidemia     Patient Active Problem List   Diagnosis Date Noted  . Dysphagia 01/25/2017  . Barrett's esophagus 01/25/2017  . Esophageal dysphagia 01/25/2017  . Barrett's esophagus with low grade dysplasia 01/25/2017  . Hyperlipidemia 02/13/2012  . Fecal incontinence 02/13/2012  . CAD (coronary artery disease) 02/13/2012    Past Surgical History:  Procedure Laterality Date  . BACK SURGERY    . cataract surgery     bilateral  . CORONARY ARTERY BYPASS GRAFT     15 yrs ago  . ESOPHAGEAL DILATION N/A 02/17/2017   Procedure: ESOPHAGEAL DILATION;  Surgeon: Rogene Houston, MD;  Location: [facility] ENDO SUITE;  Service: Endoscopy;  Laterality: N/A;  . ESOPHAGOGASTRODUODENOSCOPY N/A 02/17/2017   Procedure: ESOPHAGOGASTRODUODENOSCOPY (EGD);  Surgeon: Rogene Houston, MD;  Location: [facility] ENDO SUITE;  Service: Endoscopy;  Laterality: N/A;  12:45  . ROTATOR CUFF REPAIR     Left         Home Medications    Prior to Admission medications   Medication Sig Start Date End Date Taking? Authorizing Provider  acetaminophen (TYLENOL) 500 MG tablet Take 500-1,000 mg by mouth daily as needed for moderate pain or headache.    [provider]  aspirin 325 MG EC tablet Take 1 tablet (325 mg total) by mouth daily. 02/20/17   Rogene Houston, MD  atenolol (TENORMIN) 25 MG tablet Take 25 mg by mouth daily.      [provider]  ezetimibe (ZETIA) 10 MG tablet Take 10 mg by mouth daily.      [provider]  omeprazole (PRILOSEC) 20 MG capsule Take 20 mg by mouth daily.      [provider]  polycarbophil (FIBERCON) 625 MG tablet Take 1,250 mg by mouth 2 (two) times daily.     [provider]  pravastatin (PRAVACHOL) 20 MG tablet Take 20 mg by mouth. Take three times a week at bedtime    [provider]    Family History No family history on file.  Social History Social History   Tobacco Use  . Smoking status: Former Research scientist (life sciences)  . Smokeless tobacco: Former Systems developer  . Tobacco comment: quit more than 20 years ago  Substance Use Topics  . Alcohol use: No  . Drug use: No     Allergies   Patient has no known  allergies.   Review of Systems Review of Systems As per HPI  Physical Exam Updated Vital Signs BP (!) 162/64 (BP Location: Right Arm)   Pulse (!) 53   Temp 97.6 F (36.4 C) (Oral)   Resp 18   Ht 5\' 10"  (1.778 m)   Wt 72.6 kg   SpO2 100%   BMI 22.96 kg/m   Physical Exam Vitals signs reviewed.  Constitutional:      Appearance: He is well-developed. He is not ill-appearing.  HENT:     Head: Normocephalic.  Eyes:     General: No scleral icterus. Cardiovascular:     Rate and Rhythm: Normal rate and regular rhythm.  Pulmonary:     Effort: Pulmonary effort is normal.  Abdominal:     General: Bowel sounds are normal. There is distension. There is no abdominal bruit. There are no signs of injury.      Palpations: Abdomen is soft.     Tenderness: There is no abdominal tenderness.  Skin:    General: Skin is warm and dry.     Capillary Refill: Capillary refill takes less than 2 seconds.  Neurological:     Mental Status: He is alert. He is disoriented.     Cranial Nerves: No cranial nerve deficit.     Motor: No weakness.  Psychiatric:        Mood and Affect: Mood normal.        Behavior: Behavior normal.      ED Treatments / Results  Labs (all labs ordered are listed, but only abnormal results are displayed) Labs Reviewed  URINALYSIS, ROUTINE W REFLEX MICROSCOPIC    EKG None  Radiology No results found.  Procedures Procedures (including critical care time)  Medications Ordered in ED Medications  tamsulosin (FLOMAX) capsule 0.4 mg (0.4 mg Oral Given 12/06/18 1404)     Initial Impression / Assessment and Plan / ED Course  I have reviewed the triage vital signs and the nursing notes.  Pertinent labs & imaging results that were available during my care of the patient were reviewed by me and considered in my medical decision making (see chart for details).  Patient presenting with abdominal pain and flushing greater than thousand milliliters.  Patient has acute urinary retention without signs of acute infection.  Will place catheter and have patient follow-up with urology in the outpatient.  Also start patient on tamsulosin given his history of BPH.  Final Clinical Impressions(s) / ED Diagnoses   Final diagnoses:  Acute urinary retention  Benign prostatic hyperplasia with urinary frequency    ED Discharge Orders    None       Bonnita Hollow, MD 12/06/18 1408    Margette Fast, MD 12/07/18 1616

## 2018-12-06 NOTE — Discharge Instructions (Signed)
Please leave the catheter in place until you are able to follow-up with urology where they can remove it there.

## 2018-12-06 NOTE — ED Triage Notes (Signed)
Patient was seen PCP today for abdominal pain. Patient reports frequent urination every two hours during the night and suspects retention.

## 2018-12-10 DIAGNOSIS — R339 Retention of urine, unspecified: Secondary | ICD-10-CM | POA: Diagnosis not present

## 2018-12-17 DIAGNOSIS — R339 Retention of urine, unspecified: Secondary | ICD-10-CM | POA: Diagnosis not present

## 2018-12-28 ENCOUNTER — Other Ambulatory Visit (HOSPITAL_COMMUNITY): Payer: Self-pay | Admitting: Family Medicine

## 2019-01-01 DIAGNOSIS — R339 Retention of urine, unspecified: Secondary | ICD-10-CM | POA: Diagnosis not present

## 2019-01-04 DIAGNOSIS — R69 Illness, unspecified: Secondary | ICD-10-CM | POA: Diagnosis not present

## 2019-01-14 DIAGNOSIS — H43813 Vitreous degeneration, bilateral: Secondary | ICD-10-CM | POA: Diagnosis not present

## 2019-01-14 DIAGNOSIS — E119 Type 2 diabetes mellitus without complications: Secondary | ICD-10-CM | POA: Diagnosis not present

## 2019-01-14 DIAGNOSIS — H26493 Other secondary cataract, bilateral: Secondary | ICD-10-CM | POA: Diagnosis not present

## 2019-01-14 DIAGNOSIS — H52203 Unspecified astigmatism, bilateral: Secondary | ICD-10-CM | POA: Diagnosis not present

## 2019-01-14 DIAGNOSIS — R339 Retention of urine, unspecified: Secondary | ICD-10-CM | POA: Diagnosis not present

## 2019-01-17 ENCOUNTER — Other Ambulatory Visit: Payer: Self-pay | Admitting: Urology

## 2019-01-24 DIAGNOSIS — R69 Illness, unspecified: Secondary | ICD-10-CM | POA: Diagnosis not present

## 2019-01-24 NOTE — Patient Instructions (Signed)
[name]    Your procedure is scheduled on: 02-06-2019  Report to [facility] Main  Entrance  Report to admitting at 845 AM    Call this number if you have problems the morning of surgery [phone]   Remember: Do not eat food or drink liquids :After Midnight. BRUSH YOUR TEETH MORNING OF SURGERY AND RINSE YOUR MOUTH OUT, NO CHEWING GUM CANDY OR MINTS.     Take these medicines the morning of surgery with A SIP OF WATER: ATENOLOL, EZETIMIBE (ZETIA, OMEPRAZOLE (PRILOSEC)              You may not have any metal on your body including hair pins and              piercings  Do not wear jewelry, make-up, lotions, powders or perfumes, deodorant             Do not wear nail polish.  Do not shave  48 hours prior to surgery.              Men may shave face and neck.   Do not bring valuables to the hospital. [place].  Contacts, dentures or bridgework may not be worn into surgery.  Leave suitcase in the car. After surgery it may be brought to your room.                     [facility] - Preparing for Surgery Before surgery, you can play an important role.  Because skin is not sterile, your skin needs to be as free of germs as possible.  You can reduce the number of germs on your skin by washing with CHG (chlorahexidine gluconate) soap before surgery.  CHG is an antiseptic cleaner which kills germs and bonds with the skin to continue killing germs even after washing. Please DO NOT use if you have an allergy to CHG or antibacterial soaps.  If your skin becomes reddened/irritated stop using the CHG and inform your nurse when you arrive at Short Stay. Do not shave (including legs and underarms) for at least 48 hours prior to the first CHG shower.  You may shave your face/neck. Please follow these instructions carefully:  1.  Shower with CHG Soap the night before surgery and the  morning of Surgery.  2.  If you choose  to wash your hair, wash your hair first as usual with your  normal  shampoo.  3.  After you shampoo, rinse your hair and body thoroughly to remove the  shampoo.                           4.  Use CHG as you would any other liquid soap.  You can apply chg directly  to the skin and wash                       Gently with a scrungie or clean washcloth.  5.  Apply the CHG Soap to your body ONLY FROM THE NECK DOWN.   Do not use on face/ open                           Wound  or open sores. Avoid contact with eyes, ears mouth and genitals (private parts).                       Wash face,  Genitals (private parts) with your normal soap.             6.  Wash thoroughly, paying special attention to the area where your surgery  will be performed.  7.  Thoroughly rinse your body with warm water from the neck down.  8.  DO NOT shower/wash with your normal soap after using and rinsing off  the CHG Soap.                9.  Pat yourself dry with a clean towel.            10.  Wear clean pajamas.            11.  Place clean sheets on your bed the night of your first shower and do not  sleep with pets. Day of Surgery : Do not apply any lotions/deodorants the morning of surgery.  Please wear clean clothes to the hospital/surgery center.  FAILURE TO FOLLOW THESE INSTRUCTIONS MAY RESULT IN THE CANCELLATION OF YOUR SURGERY PATIENT SIGNATURE_________________________________  NURSE SIGNATURE__________________________________  ________________________________________________________________________

## 2019-01-29 ENCOUNTER — Ambulatory Visit: Payer: Medicare HMO | Admitting: Urology

## 2019-02-01 ENCOUNTER — Other Ambulatory Visit: Payer: Self-pay

## 2019-02-01 ENCOUNTER — Encounter (HOSPITAL_COMMUNITY): Payer: Self-pay

## 2019-02-01 ENCOUNTER — Encounter (HOSPITAL_COMMUNITY)
Admission: RE | Admit: 2019-02-01 | Discharge: 2019-02-01 | Disposition: A | Discharge status: Home / Self Care | Payer: Medicare HMO | Source: Ambulatory Visit | Attending: Urology | Admitting: Urology

## 2019-02-01 DIAGNOSIS — I10 Essential (primary) hypertension: Secondary | ICD-10-CM | POA: Diagnosis not present

## 2019-02-01 DIAGNOSIS — Z01818 Encounter for other preprocedural examination: Secondary | ICD-10-CM | POA: Diagnosis not present

## 2019-02-01 HISTORY — DX: Presence of other specified devices: Z97.8

## 2019-02-01 HISTORY — DX: Retention of urine, unspecified: R33.9

## 2019-02-01 HISTORY — DX: Atherosclerotic heart disease of native coronary artery without angina pectoris: I25.10

## 2019-02-01 HISTORY — DX: Acute myocardial infarction, unspecified: I21.9

## 2019-02-01 HISTORY — DX: Presence of urogenital implants: Z96.0

## 2019-02-01 LAB — BASIC METABOLIC PANEL
Anion gap: 5 (ref 5–15)
BUN: 19 mg/dL (ref 8–23)
CO2: 28 mmol/L (ref 22–32)
Calcium: 9.1 mg/dL (ref 8.9–10.3)
Chloride: 107 mmol/L (ref 98–111)
Creatinine, Ser: 1.02 mg/dL (ref 0.61–1.24)
GFR calc Af Amer: >60 mL/min (ref >60)
GFR calc non Af Amer: >60 mL/min (ref >60)
Glucose, Bld: 87 mg/dL (ref 70–99)
Potassium: 4.7 mmol/L (ref 3.5–5.1)
Sodium: 140 mmol/L (ref 135–145)

## 2019-02-01 LAB — CBC
HCT: 43.9 % (ref 39.0–52.0)
HEMOGLOBIN: 14 g/dL (ref 13.0–17.0)
MCH: 30.8 pg (ref 26.0–34.0)
MCHC: 31.9 g/dL (ref 30.0–36.0)
MCV: 96.5 fL (ref 80.0–100.0)
Platelets: 152 10*3/uL (ref 150–400)
RBC: 4.55 MIL/uL (ref 4.22–5.81)
RDW: 12.8 % (ref 11.5–15.5)
WBC: 5.2 10*3/uL (ref 4.0–10.5)
nRBC: 0 % (ref 0.0–0.2)

## 2019-02-01 NOTE — Progress Notes (Signed)
Chest xray 06-18-18 epic

## 2019-02-04 NOTE — Progress Notes (Signed)
PCP: roy fagan  CARDIOLOGIST: none  INFO IN Epic:ekg 02-01-2019  INFO ON CHART:  BLOOD THINNERS AND LAST DOSES:none ____________________________________  PATIENT SYMPTOMS AT TIME OF PREOP: none

## 2019-02-05 DIAGNOSIS — E119 Type 2 diabetes mellitus without complications: Secondary | ICD-10-CM | POA: Diagnosis not present

## 2019-02-05 MED ORDER — GENTAMICIN SULFATE 40 MG/ML IJ SOLN
5.0000 mg/kg | INTRAVENOUS | Status: AC
  Administered 2019-02-06: 360 mg via INTRAVENOUS
  Filled 2019-02-05: qty 9

## 2019-02-06 ENCOUNTER — Encounter (HOSPITAL_COMMUNITY): Payer: Self-pay

## 2019-02-06 ENCOUNTER — Observation Stay (HOSPITAL_COMMUNITY)
Admission: RE | Admit: 2019-02-06 | Discharge: 2019-02-07 | Disposition: A | Discharge status: Home / Self Care | Payer: Medicare HMO | Attending: Urology | Admitting: Urology

## 2019-02-06 ENCOUNTER — Ambulatory Visit (HOSPITAL_COMMUNITY): Payer: Medicare HMO | Admitting: Anesthesiology

## 2019-02-06 ENCOUNTER — Encounter (HOSPITAL_COMMUNITY)
Admission: RE | Disposition: A | Discharge status: Home / Self Care | Payer: Self-pay | Source: Home / Self Care | Attending: Urology

## 2019-02-06 ENCOUNTER — Ambulatory Visit (HOSPITAL_COMMUNITY): Payer: Medicare HMO | Admitting: Physician Assistant

## 2019-02-06 ENCOUNTER — Other Ambulatory Visit: Payer: Self-pay

## 2019-02-06 DIAGNOSIS — Z8249 Family history of ischemic heart disease and other diseases of the circulatory system: Secondary | ICD-10-CM | POA: Insufficient documentation

## 2019-02-06 DIAGNOSIS — R3912 Poor urinary stream: Secondary | ICD-10-CM | POA: Diagnosis not present

## 2019-02-06 DIAGNOSIS — I2510 Atherosclerotic heart disease of native coronary artery without angina pectoris: Secondary | ICD-10-CM | POA: Diagnosis not present

## 2019-02-06 DIAGNOSIS — R338 Other retention of urine: Secondary | ICD-10-CM | POA: Diagnosis not present

## 2019-02-06 DIAGNOSIS — N401 Enlarged prostate with lower urinary tract symptoms: Principal | ICD-10-CM | POA: Insufficient documentation

## 2019-02-06 DIAGNOSIS — N320 Bladder-neck obstruction: Secondary | ICD-10-CM | POA: Insufficient documentation

## 2019-02-06 DIAGNOSIS — Z87891 Personal history of nicotine dependence: Secondary | ICD-10-CM | POA: Diagnosis not present

## 2019-02-06 DIAGNOSIS — E7800 Pure hypercholesterolemia, unspecified: Secondary | ICD-10-CM | POA: Insufficient documentation

## 2019-02-06 DIAGNOSIS — K219 Gastro-esophageal reflux disease without esophagitis: Secondary | ICD-10-CM | POA: Diagnosis not present

## 2019-02-06 DIAGNOSIS — N138 Other obstructive and reflux uropathy: Secondary | ICD-10-CM | POA: Diagnosis present

## 2019-02-06 DIAGNOSIS — R339 Retention of urine, unspecified: Secondary | ICD-10-CM | POA: Diagnosis not present

## 2019-02-06 DIAGNOSIS — E785 Hyperlipidemia, unspecified: Secondary | ICD-10-CM | POA: Diagnosis not present

## 2019-02-06 DIAGNOSIS — Z836 Family history of other diseases of the respiratory system: Secondary | ICD-10-CM | POA: Diagnosis not present

## 2019-02-06 DIAGNOSIS — I10 Essential (primary) hypertension: Secondary | ICD-10-CM | POA: Diagnosis not present

## 2019-02-06 HISTORY — PX: TRANSURETHRAL RESECTION OF PROSTATE: SHX73

## 2019-02-06 LAB — CBC
HCT: 38.1 % — ABNORMAL LOW (ref 39.0–52.0)
Hemoglobin: 11.9 g/dL — ABNORMAL LOW (ref 13.0–17.0)
MCH: 30.4 pg (ref 26.0–34.0)
MCHC: 31.2 g/dL (ref 30.0–36.0)
MCV: 97.2 fL (ref 80.0–100.0)
Platelets: 155 10*3/uL (ref 150–400)
RBC: 3.92 MIL/uL — AB (ref 4.22–5.81)
RDW: 12.9 % (ref 11.5–15.5)
WBC: 9.4 10*3/uL (ref 4.0–10.5)
nRBC: 0 % (ref 0.0–0.2)

## 2019-02-06 LAB — BASIC METABOLIC PANEL
Anion gap: 5 (ref 5–15)
BUN: 19 mg/dL (ref 8–23)
CO2: 27 mmol/L (ref 22–32)
Calcium: 8.6 mg/dL — ABNORMAL LOW (ref 8.9–10.3)
Chloride: 108 mmol/L (ref 98–111)
Creatinine, Ser: 1.18 mg/dL (ref 0.61–1.24)
GFR calc Af Amer: >60 mL/min (ref >60)
GFR calc non Af Amer: 57 mL/min — ABNORMAL LOW (ref >60)
Glucose, Bld: 120 mg/dL — ABNORMAL HIGH (ref 70–99)
Potassium: 5.1 mmol/L (ref 3.5–5.1)
Sodium: 140 mmol/L (ref 135–145)

## 2019-02-06 LAB — HEMOGLOBIN AND HEMATOCRIT, BLOOD
HCT: 35.1 % — ABNORMAL LOW (ref 39.0–52.0)
Hemoglobin: 11.1 g/dL — ABNORMAL LOW (ref 13.0–17.0)

## 2019-02-06 SURGERY — TURP (TRANSURETHRAL RESECTION OF PROSTATE)
Anesthesia: General

## 2019-02-06 MED ORDER — BELLADONNA ALKALOIDS-OPIUM 16.2-60 MG RE SUPP: 1.0000 | Freq: Three times a day (TID) | RECTAL | Status: DC | PRN

## 2019-02-06 MED ORDER — ATENOLOL 25 MG PO TABS
25.0000 mg | ORAL_TABLET | Freq: Every day | ORAL | Status: DC
  Administered 2019-02-07: 25 mg via ORAL
  Filled 2019-02-06: qty 1

## 2019-02-06 MED ORDER — FENTANYL CITRATE (PF) 100 MCG/2ML IJ SOLN
INTRAMUSCULAR | Status: AC
  Filled 2019-02-06: qty 2

## 2019-02-06 MED ORDER — LIDOCAINE 2% (20 MG/ML) 5 ML SYRINGE
INTRAMUSCULAR | Status: DC | PRN
  Administered 2019-02-06: 60 mg via INTRAVENOUS

## 2019-02-06 MED ORDER — BELLADONNA ALKALOIDS-OPIUM 16.2-60 MG RE SUPP
RECTAL | Status: DC | PRN
  Administered 2019-02-06: 1 via RECTAL

## 2019-02-06 MED ORDER — PROPOFOL 10 MG/ML IV BOLUS
INTRAVENOUS | Status: DC | PRN
  Administered 2019-02-06: 130 ug via INTRAVENOUS

## 2019-02-06 MED ORDER — ONDANSETRON HCL 4 MG/2ML IJ SOLN
INTRAMUSCULAR | Status: DC | PRN
  Administered 2019-02-06: 4 mg via INTRAVENOUS

## 2019-02-06 MED ORDER — PROPOFOL 10 MG/ML IV BOLUS
INTRAVENOUS | Status: AC
  Filled 2019-02-06: qty 20

## 2019-02-06 MED ORDER — BELLADONNA ALKALOIDS-OPIUM 16.2-30 MG RE SUPP
RECTAL | Status: AC
  Filled 2019-02-06: qty 1

## 2019-02-06 MED ORDER — SODIUM CHLORIDE 0.9 % IR SOLN
3000.0000 mL | Status: DC
  Administered 2019-02-06: 3000 mL

## 2019-02-06 MED ORDER — EPHEDRINE SULFATE-NACL 50-0.9 MG/10ML-% IV SOSY
PREFILLED_SYRINGE | INTRAVENOUS | Status: DC | PRN
  Administered 2019-02-06 (×2): 5 mg via INTRAVENOUS

## 2019-02-06 MED ORDER — SODIUM CHLORIDE 0.45 % IV SOLN
INTRAVENOUS | Status: DC
  Administered 2019-02-06 – 2019-02-07 (×2): via INTRAVENOUS

## 2019-02-06 MED ORDER — KETOROLAC TROMETHAMINE 15 MG/ML IJ SOLN: 15.0000 mg | Freq: Once | INTRAMUSCULAR | Status: DC

## 2019-02-06 MED ORDER — MEPERIDINE HCL 50 MG/ML IJ SOLN: 6.2500 mg | INTRAMUSCULAR | Status: DC | PRN

## 2019-02-06 MED ORDER — HYDRALAZINE HCL 20 MG/ML IJ SOLN: 5.0000 mg | INTRAMUSCULAR | Status: DC | PRN

## 2019-02-06 MED ORDER — LACTATED RINGERS IV SOLN
INTRAVENOUS | Status: DC
  Administered 2019-02-06 (×3): via INTRAVENOUS

## 2019-02-06 MED ORDER — FENTANYL CITRATE (PF) 100 MCG/2ML IJ SOLN
INTRAMUSCULAR | Status: DC | PRN
  Administered 2019-02-06: 100 ug via INTRAVENOUS
  Administered 2019-02-06 (×2): 50 ug via INTRAVENOUS

## 2019-02-06 MED ORDER — PANTOPRAZOLE SODIUM 40 MG PO TBEC
40.0000 mg | DELAYED_RELEASE_TABLET | Freq: Every day | ORAL | Status: DC
  Administered 2019-02-07: 40 mg via ORAL
  Filled 2019-02-06: qty 1

## 2019-02-06 MED ORDER — TRAMADOL HCL 50 MG PO TABS: 50.0000 mg | ORAL_TABLET | Freq: Four times a day (QID) | ORAL | Status: DC | PRN

## 2019-02-06 MED ORDER — PROMETHAZINE HCL 25 MG/ML IJ SOLN: 6.2500 mg | INTRAMUSCULAR | Status: DC | PRN

## 2019-02-06 MED ORDER — ACETAMINOPHEN 10 MG/ML IV SOLN
1000.0000 mg | Freq: Four times a day (QID) | INTRAVENOUS | Status: AC
  Administered 2019-02-06 – 2019-02-07 (×3): 1000 mg via INTRAVENOUS
  Filled 2019-02-06 (×3): qty 100

## 2019-02-06 MED ORDER — SODIUM CHLORIDE 0.9 % IR SOLN
Status: DC | PRN
  Administered 2019-02-06: 48000 mL via INTRAVESICAL

## 2019-02-06 MED ORDER — LIDOCAINE 2% (20 MG/ML) 5 ML SYRINGE
INTRAMUSCULAR | Status: AC
  Filled 2019-02-06: qty 5

## 2019-02-06 MED ORDER — FENTANYL CITRATE (PF) 100 MCG/2ML IJ SOLN: 25.0000 ug | INTRAMUSCULAR | Status: DC | PRN

## 2019-02-06 MED ORDER — ONDANSETRON HCL 4 MG/2ML IJ SOLN: 4.0000 mg | Freq: Four times a day (QID) | INTRAMUSCULAR | Status: DC | PRN

## 2019-02-06 MED ORDER — STERILE WATER FOR IRRIGATION IR SOLN
Status: DC | PRN
  Administered 2019-02-06: 250 mL

## 2019-02-06 MED ORDER — 0.9 % SODIUM CHLORIDE (POUR BTL) OPTIME
TOPICAL | Status: DC | PRN
  Administered 2019-02-06: 1000 mL

## 2019-02-06 MED ORDER — EPHEDRINE 5 MG/ML INJ
INTRAVENOUS | Status: AC
  Filled 2019-02-06: qty 10

## 2019-02-06 MED ORDER — ONDANSETRON HCL 4 MG/2ML IJ SOLN
INTRAMUSCULAR | Status: AC
  Filled 2019-02-06: qty 2

## 2019-02-06 SURGICAL SUPPLY — 20 items
BAG URINE DRAINAGE (UROLOGICAL SUPPLIES) ×2 IMPLANT
BAG URO CATCHER STRL LF (MISCELLANEOUS) ×2 IMPLANT
CATH FOLEY 2WAY SLVR 30CC 24FR (CATHETERS) ×2 IMPLANT
CATH FOLEY 3WAY 30CC 22FR (CATHETERS) IMPLANT
CATH FOLEY 3WAY 30CC 24FR (CATHETERS)
CATH URTH STD 24FR FL 3W 2 (CATHETERS) IMPLANT
COVER WAND RF STERILE (DRAPES) IMPLANT
ELECT REM PT RETURN 15FT ADLT (MISCELLANEOUS) ×2 IMPLANT
GLOVE BIO SURGEON STRL SZ7.5 (GLOVE) ×2 IMPLANT
GOWN STRL REUS W/ TWL XL LVL3 (GOWN DISPOSABLE) ×1 IMPLANT
GOWN STRL REUS W/TWL XL LVL3 (GOWN DISPOSABLE) ×3 IMPLANT
HOLDER FOLEY CATH W/STRAP (MISCELLANEOUS) ×2 IMPLANT
LOOP CUT BIPOLAR 24F LRG (ELECTROSURGICAL) ×2 IMPLANT
MANIFOLD NEPTUNE II (INSTRUMENTS) ×2 IMPLANT
PACK CYSTO (CUSTOM PROCEDURE TRAY) ×2 IMPLANT
SET ASPIRATION TUBING (TUBING) IMPLANT
SYR 30ML LL (SYRINGE) IMPLANT
SYRINGE IRR TOOMEY STRL 70CC (SYRINGE) ×2 IMPLANT
TUBING CONNECTING 10 (TUBING) ×2 IMPLANT
TUBING UROLOGY SET (TUBING) ×2 IMPLANT

## 2019-02-06 NOTE — Transfer of Care (Signed)
Immediate Anesthesia Transfer of Care Note  Patient: [name]  Procedure(s) Performed: TRANSURETHRAL RESECTION OF THE PROSTATE (TURP) (N/A )  Patient Location: PACU  Anesthesia Type:General  Level of Consciousness: sedated  Airway & Oxygen Therapy: Patient Spontanous Breathing and Patient connected to face mask oxygen  Post-op Assessment: Report given to RN and Post -op Vital signs reviewed and stable  Post vital signs: Reviewed and stable  Last Vitals:  Vitals Value Taken Time  BP    Temp    Pulse    Resp    SpO2      Last Pain:  Vitals:   02/06/19 0932  PainSc: 0-No pain         Complications: No apparent anesthesia complications

## 2019-02-06 NOTE — H&P (Signed)
Acute urinary retention  HPI: [name] is a 85 year-old male established patient who is here for further eval and management of urinary retention.  It was first diagnosed 12/06/2018.   He currently has an indwelling catheter. The catheter was last changed 2 weeks. The catheter has been in his bladder for approximately 12/06/2018. His urinary retention is being treated with foley catheter.   He does not have a good size and strength to his urinary stream. Prior to this episode, he did not have to bear down/strain to get his stream started. He is having problems with emptying his bladder well. He is not having problems with urinary control or incontinence.   His current symptoms are not related to undergoing a surgical procedure. The patient has not had prostate surgery in the past.   12/10/18: >1.0L in his bladder at the time of the catheter placement  Started on Tamsulosin in ED.   12/17/18: He underwent voiding trial on 12/16, but presented to clinic later that afternoon with inability to void. He states that he has been tolerating his catheter and it has been draining well. He denies fevers. He remains on Tamsulosin.   Interval: The patient presents today for follow-up. He failed his voiding trial at his last visit. 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 He was able to void some, but the catheter was replaced and a PVR was noted to be 635 cc. There was some mild loss of compliance. His bladder was noted to be trabeculated. The patient is tolerated the catheter well, and has gotten used to it. He is ready to proceed with more aggressive intervention. He is not willing to learn how to clean intermittent catheterization. He has had some hematuria in his catheter, this has been associated with constipation straining to defecate. He denies any suprapubic pain or fever/chills     QOL Score: He would feel terrible  if he had to live with his urinary condition the way it is now for the rest of his life.   Calculated QOL Symptom Score: 6    ALLERGIES: None   MEDICATIONS: Aspirin 325 mg tablet  Omeprazole 20 mg tablet, delayed release  Tamsulosin Hcl 0.4 mg capsule 1 capsule PO Daily  Atenolol 25 mg tablet  Fiber Therapy  Flomax  Zetia     GU PSH: Complex cystometrogram, w/ void pressure and urethral pressure profile studies, any technique - 01/01/2019 Complex Uroflow - 01/01/2019 Emg surf Electrd - 01/01/2019 Inject For cystogram - 01/01/2019 Intrabd voidng Press - 01/01/2019      PSH Notes: Rotator Cuff, Back surgery, Heart Bypass 1996   NON-GU PSH: None   GU PMH: Urinary Retention, Unspec - 12/17/2018, - 12/10/2018      PMH Notes: Heart attack    NON-GU PMH: GERD Hypercholesterolemia    FAMILY HISTORY: father deceased - Runs in Family heart - Father mother deceased - Runs in Family pneumonia - Mother   SOCIAL HISTORY: Marital Status: Married Preferred Language: English; Ethnicity: Not Hispanic Or Latino; Race: White Current Smoking Status: Patient does not smoke anymore. Has not smoked since 11/25/1998. Smoked for 20 years.   Tobacco Use Assessment Completed: Used Tobacco in last 30 days? Drinks 3 caffeinated drinks per day. 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    REVIEW OF SYSTEMS:    GU Review Male:   Patient denies frequent urination, hard to postpone urination, burning/ pain with urination, get up at night to urinate, leakage  of urine, stream starts and stops, trouble starting your stream, have to strain to urinate , erection problems, and penile pain.  Gastrointestinal (Upper):   Patient denies nausea, vomiting, and indigestion/ heartburn.  Gastrointestinal (Lower):   Patient denies diarrhea and constipation.  Constitutional:   Patient denies fever, night sweats, weight loss, and fatigue.  Skin:   Patient denies skin rash/ lesion and itching.  Eyes:   Patient denies  blurred vision and double vision.  Ears/ Nose/ Throat:   Patient denies sore throat and sinus problems.  Hematologic/Lymphatic:   Patient denies swollen glands and easy bruising.  Cardiovascular:   Patient denies chest pains and leg swelling.  Respiratory:   Patient denies cough and shortness of breath.  Endocrine:   Patient denies excessive thirst.  Musculoskeletal:   Patient denies back pain and joint pain.  Neurological:   Patient denies headaches and dizziness.  Psychologic:   Patient denies depression and anxiety.   VITAL SIGNS:      01/14/2019 01:27 PM  Weight 160 lb / 72.57 kg  Height 70 in / 177.8 cm  BP 124/61 mmHg  Heart Rate 57 /min  BMI 23.0 kg/m   MULTI-SYSTEM PHYSICAL EXAMINATION:    Constitutional: Well-nourished. No physical deformities. Normally developed. Good grooming.  Respiratory: Normal breath sounds. No labored breathing, no use of accessory muscles.   Cardiovascular: Regular rate and rhythm. No murmur, no gallop. Normal temperature, normal extremity pulses, no swelling, no varicosities.      PAST DATA REVIEWED:  Source Of History:  Patient  Records Review:   Previous Doctor Records, Previous Patient Records, POC Tool   PROCEDURES: None   ASSESSMENT:      ICD-10 Details  1 GU:   Urinary Retention, Unspec - R33.9    PLAN:           Schedule Return Visit/Planned Activity: 2 Weeks - Nurse Visit             Note: Catheter change - Forestine Na  Return Visit/Planned Activity: ASAP - Schedule Surgery          Document Letter(s):  Created for Patient: Clinical Summary         Notes:   The patient's urodynamic evaluation demonstrated some detrusor function with significant obstruction. I reviewed the findings with the patient and we discussed treatment options. I recommended that he strongly consider a TURP. I think his prostate is probably too big to perform a uro lift. I had described the surgery for the patient including the risks and benefits. He will  benefit from this long term. He understands that he may need a Foley catheter immediately upon discharge from the hospital but ultimately should have it removed and him be able to void successfully.   I will reach out to Dr. Willey Blade to ensure that the patient is otherwise healthy. Will try to get this scheduled as soon as possible.

## 2019-02-06 NOTE — Interval H&P Note (Signed)
History and Physical Interval Note:  02/06/2019 10:37 AM  [name]  has presented today for surgery, with the diagnosis of urinary retention  The various methods of treatment have been discussed with the patient and family. After consideration of risks, benefits and other options for treatment, the patient has consented to  Procedure(s): TRANSURETHRAL RESECTION OF THE PROSTATE (TURP) (N/A) as a surgical intervention .  The patient's history has been reviewed, patient examined, no change in status, stable for surgery.  I have reviewed the patient's chart and labs.  Questions were answered to the patient's satisfaction.     [name]

## 2019-02-06 NOTE — Op Note (Signed)
Preoperative diagnosis:  1. Bladder outlet obstruction 2. Urinary retention  Postoperative diagnosis:  1. same   Procedure:  1. Transurethral resection of prostate  Surgeon: [name], MD   Anesthesia: General   Complications: None   Intraoperative findings: Cystoscopy demonstrated  a normal appearing bladder mucosa without tumor/stones/or abnormalities.  UOs were orthopic.  Significant trabeculations noted.  Prostate demonstrated a large median lobe with lateral lobe obstruction.  EBL: 450cc  Specimens: Prostate chips  Indication: [name] is a 85 y.o. patient with bladder outlet obstruction/urinary retention/obstructive voiding symptoms.  After reviewing the management options for treatment, he elected to proceed with the above surgical procedure(s). We have discussed the potential benefits and risks of the procedure, side effects of the proposed treatment, the likelihood of the patient achieving the goals of the procedure, and any potential problems that might occur during the procedure or recuperation. Informed consent has been obtained.  Description of procedure:  The patient was taken to the operating room and general anesthesia was induced. The patient was placed in the dorsal lithotomy position, prepped and draped in the usual sterile fashion, and preoperative antibiotics were administered. A preoperative time-out was performed.   I then gently passed the 21 French 30 cystoscope into the patient's urethra and up into the bladder under visual guidance. A 360 cystoscopic evaluation was then performed with the above findings.  I then removed the 21 French cystoscopic sheath and replacement with a 26 French resectoscope sheath was passed and using the visual obturator under direct vision. An exchange the obturator for the resectoscope itself and the loop cautery. I then proceeded to create grooves within the prostate at the 7:00 position extending the defect from  the bladder neck at 7:00 down to the prostate apex just lateral to the verumontanum. I took this groove down to the capsule. I then performed a similar maneuver on the patient's left side at the 5:00 position taking the prostate down from the bladder neck to the apex and down the prostatic capsule. At this point I proceeded to resect the patient's right lateral lobe in a systematic fashion moving from the 7:00 position up to approximately 11. The resection was taken down to the prostate capsule. Hemostasis was achieved on this side prior to moving to the patient's left lateral lobe. A similar sequence was performed taking the prostate down from the 5:00 position to approximately the 1:00 position to the level of the prostatic capsule. I then completed the resection of the posterior wall as well as the area between 5:00 and 7:00 along the bladder neck. I was careful not to undermine the bladder neck or resect the inferior. I then did resect the area that was protruding into the prostatic urethra anteriorly.   Once I was satisfied that the prostate had been adequately resected I evacuated the prostate chips using a Toomey syringe. I then reintroduced the resectoscope and ensured adequate hemostasis. I then left the bladder full and removed the resectoscope entirely. Exam under anesthesia demonstrated a normal sized prostate with no nodules.  I then placed a 22 French three-way Foley catheter. I irrigated the catheter gently and removed all debris and clots. I then placed the patient on gentle continuous bladder irrigation.  He subsequently extubated and returned to PACU in excellent condition.  [name], MD

## 2019-02-06 NOTE — Anesthesia Postprocedure Evaluation (Signed)
Anesthesia Post Note  Patient: [name]  Procedure(s) Performed: TRANSURETHRAL RESECTION OF THE PROSTATE (TURP) (N/A )     Patient location during evaluation: PACU Anesthesia Type: General Level of consciousness: awake Pain management: pain level controlled Vital Signs Assessment: post-procedure vital signs reviewed and stable Respiratory status: spontaneous breathing Cardiovascular status: stable Postop Assessment: no apparent nausea or vomiting Anesthetic complications: no    Last Vitals:  Vitals:   02/06/19 1315 02/06/19 1330  BP: 123/61 120/69  Pulse: 64 69  Resp: 15 12  Temp:    SpO2: 100% 100%    Last Pain:  Vitals:   02/06/19 1330  PainSc: 2    Pain Goal:                   Huston Foley

## 2019-02-06 NOTE — Anesthesia Procedure Notes (Signed)
Procedure Name: LMA Insertion Date/Time: 02/06/2019 10:51 AM Performed by: Eben Burow, CRNA Pre-anesthesia Checklist: Patient identified, Emergency Drugs available, Suction available, Patient being monitored and Timeout performed Patient Re-evaluated:Patient Re-evaluated prior to induction Oxygen Delivery Method: Circle system utilized Preoxygenation: Pre-oxygenation with 100% oxygen Induction Type: IV induction Ventilation: Mask ventilation without difficulty LMA: LMA inserted LMA Size: 5.0 Number of attempts: 1 Tube secured with: Tape Dental Injury: Teeth and Oropharynx as per pre-operative assessment

## 2019-02-06 NOTE — Anesthesia Preprocedure Evaluation (Signed)
Anesthesia Evaluation  Patient identified by MRN, date of birth, ID band Patient awake    Reviewed: Allergy & Precautions, NPO status , Patient's Chart, lab work & pertinent test results, reviewed documented beta blocker date and time   Airway Mallampati: I       Dental no notable dental hx.    Pulmonary former smoker,    Pulmonary exam normal        Cardiovascular hypertension, Pt. on home beta blockers Normal cardiovascular exam Rhythm:Regular Rate:Normal     Neuro/Psych negative neurological ROS  negative psych ROS   GI/Hepatic Neg liver ROS, GERD  Medicated,  Endo/Other  negative endocrine ROS  Renal/GU negative Renal ROS  negative genitourinary   Musculoskeletal negative musculoskeletal ROS (+)   Abdominal Normal abdominal exam  (+)   Peds  Hematology negative hematology ROS (+)   Anesthesia Other Findings   Reproductive/Obstetrics                             Anesthesia Physical Anesthesia Plan  ASA: II  Anesthesia Plan: General   Post-op Pain Management:    Induction: Intravenous  PONV Risk Score and Plan: 3 and Ondansetron  Airway Management Planned: LMA  Additional Equipment:   Intra-op Plan:   Post-operative Plan: Extubation in OR  Informed Consent: I have reviewed the patients History and Physical, chart, labs and discussed the procedure including the risks, benefits and alternatives for the proposed anesthesia with the patient or authorized representative who has indicated his/her understanding and acceptance.       Plan Discussed with: CRNA  Anesthesia Plan Comments:         Anesthesia Quick Evaluation

## 2019-02-07 ENCOUNTER — Encounter (HOSPITAL_COMMUNITY): Payer: Self-pay | Admitting: Urology

## 2019-02-07 DIAGNOSIS — N401 Enlarged prostate with lower urinary tract symptoms: Secondary | ICD-10-CM | POA: Diagnosis not present

## 2019-02-07 LAB — BASIC METABOLIC PANEL
Anion gap: 7 (ref 5–15)
BUN: 18 mg/dL (ref 8–23)
CHLORIDE: 105 mmol/L (ref 98–111)
CO2: 26 mmol/L (ref 22–32)
Calcium: 8.1 mg/dL — ABNORMAL LOW (ref 8.9–10.3)
Creatinine, Ser: 1.08 mg/dL (ref 0.61–1.24)
GFR calc Af Amer: >60 mL/min (ref >60)
GFR calc non Af Amer: >60 mL/min (ref >60)
Glucose, Bld: 118 mg/dL — ABNORMAL HIGH (ref 70–99)
Potassium: 4.3 mmol/L (ref 3.5–5.1)
Sodium: 138 mmol/L (ref 135–145)

## 2019-02-07 LAB — CBC
HCT: 33.4 % — ABNORMAL LOW (ref 39.0–52.0)
Hemoglobin: 10.6 g/dL — ABNORMAL LOW (ref 13.0–17.0)
MCH: 30.9 pg (ref 26.0–34.0)
MCHC: 31.7 g/dL (ref 30.0–36.0)
MCV: 97.4 fL (ref 80.0–100.0)
Platelets: 138 10*3/uL — ABNORMAL LOW (ref 150–400)
RBC: 3.43 MIL/uL — ABNORMAL LOW (ref 4.22–5.81)
RDW: 13 % (ref 11.5–15.5)
WBC: 6.3 10*3/uL (ref 4.0–10.5)
nRBC: 0 % (ref 0.0–0.2)

## 2019-02-07 MED ORDER — CIPROFLOXACIN HCL 500 MG PO TABS
500.0000 mg | ORAL_TABLET | Freq: Once | ORAL | Status: AC
  Administered 2019-02-07: 500 mg via ORAL
  Filled 2019-02-07: qty 1

## 2019-02-07 MED ORDER — CIPROFLOXACIN HCL 500 MG PO TABS: 500.0000 mg | ORAL_TABLET | Freq: Two times a day (BID) | ORAL | 0 refills | Status: DC

## 2019-02-07 NOTE — Progress Notes (Signed)
Urology Inpatient Progress Report  urinary retention  Procedure(s): TRANSURETHRAL RESECTION OF THE PROSTATE (TURP)  1 Day Post-Op   Intv/Subj: No acute events overnight. Patient is without complaint.  Active Problems:   BPH with urinary obstruction  Current Facility-Administered Medications  Medication Dose Route Frequency Provider Last Rate Last Dose  . acetaminophen (OFIRMEV) IV 1,000 mg  1,000 mg Intravenous Q6H [name], MD 400 mL/hr at 02/07/19 0531 1,000 mg at 02/07/19 0531  . atenolol (TENORMIN) tablet 25 mg  25 mg Oral Daily [name], MD      . ciprofloxacin (CIPRO) tablet 500 mg  500 mg Oral Once [name], MD      . hydrALAZINE (APRESOLINE) injection 5 mg  5 mg Intravenous Q4H PRN [name], MD      . ondansetron [facility]) injection 4 mg  4 mg Intravenous Q6H PRN [name], MD      . pantoprazole (PROTONIX) EC tablet 40 mg  40 mg Oral Daily [name], MD      . sodium chloride irrigation 0.9 % 3,000 mL  3,000 mL Irrigation Continuous [name], MD   3,000 mL at 02/06/19 1632  . traMADol (ULTRAM) tablet 50-100 mg  50-100 mg Oral Q6H PRN [name], MD         Objective: Vital: Vitals:   02/06/19 1551 02/06/19 1600 02/06/19 2019 02/07/19 0533  BP: 130/61  (!) 101/50 (!) 129/58  Pulse: 64  (!) 53 (!) 55  Resp: 16  20 18   Temp: 98 F (36.7 C)  98.1 F (36.7 C) 97.7 F (36.5 C)  TempSrc: Oral  Oral Oral  SpO2: 100%  98% 98%  Weight:  67.2 kg    Height:  5\' 10"  (1.778 m)     I/Os: I/O last 3 completed shifts: In: 12373.2 [P.O.:720; I.V.:3453.2; Other:7900; IV Piggyback:300] Out: 03474 [QVZDG:38756; Blood:450]  Physical Exam:  General: Patient is in no apparent distress Lungs: Normal respiratory effort, chest expands symmetrically. GI: The abdomen is soft and nontender without mass. Foley:  Straw colored urine, slow CBI gtt  Ext: lower extremities symmetric  Lab  Results: Recent Labs    02/06/19 1444 02/06/19 1644 02/07/19 0403  WBC 9.4  --  6.3  HGB 11.9* 11.1* 10.6*  HCT 38.1* 35.1* 33.4*   Recent Labs    02/06/19 1444 02/07/19 0403  NA 140 138  K 5.1 4.3  CL 108 105  CO2 27 26  GLUCOSE 120* 118*  BUN 19 18  CREATININE 1.18 1.08  CALCIUM 8.6* 8.1*   No results for input(s): LABPT, INR in the last 72 hours. No results for input(s): LABURIN in the last 72 hours. No results found for this or any previous visit.  Studies/Results: No results found.  Assessment: Procedure(s): TRANSURETHRAL RESECTION OF THE PROSTATE (TURP), 1 Day Post-Op  doing well.  Plan: Voiding trial this AM. Home around noon.   Louis Meckel, MD Urology 02/07/2019, 7:51 AM

## 2019-02-07 NOTE — Discharge Summary (Signed)
Date of admission: 02/06/2019  Date of discharge: 02/07/2019  Admission diagnosis: BPH with urinary retention  Discharge diagnosis: same  Secondary diagnoses:  Patient Active Problem List   Diagnosis Date Noted  . BPH with urinary obstruction 02/06/2019  . Dysphagia 01/25/2017  . Barrett's esophagus 01/25/2017  . Esophageal dysphagia 01/25/2017  . Barrett's esophagus with low grade dysplasia 01/25/2017  . Hyperlipidemia 02/13/2012  . Fecal incontinence 02/13/2012  . CAD (coronary artery disease) 02/13/2012    Procedures performed: Procedure(s): TRANSURETHRAL RESECTION OF THE PROSTATE (TURP)  History and Physical: For full details, please see admission history and physical. Briefly, [name] is a 85 y.o. year old patient with urinary retention.   Hospital Course: Patient tolerated the procedure well.  He was then transferred to the floor after an uneventful PACU stay.  His hospital course was uncomplicated.  On POD#1 he had met discharge criteria: was eating a regular diet, was up and ambulating independently,  pain was well controlled, was voiding without a catheter, and was ready to for discharge.   Laboratory values:  Recent Labs    02/06/19 1444 02/06/19 1644 02/07/19 0403  WBC 9.4  --  6.3  HGB 11.9* 11.1* 10.6*  HCT 38.1* 35.1* 33.4*   Recent Labs    02/06/19 1444 02/07/19 0403  NA 140 138  K 5.1 4.3  CL 108 105  CO2 27 26  GLUCOSE 120* 118*  BUN 19 18  CREATININE 1.18 1.08  CALCIUM 8.6* 8.1*   No results for input(s): LABPT, INR in the last 72 hours. No results for input(s): LABURIN in the last 72 hours. No results found for this or any previous visit.  Disposition: Home  Discharge instruction: The patient was instructed to be ambulatory but told to refrain from heavy lifting, strenuous activity, or driving.   Discharge medications:  Allergies as of 02/07/2019   No Known Allergies     Medication List    TAKE these medications    acetaminophen 500 MG tablet Commonly known as:  TYLENOL Take 1,000 mg by mouth every 6 (six) hours as needed for moderate pain or headache.   aspirin 325 MG EC tablet Take 1 tablet (325 mg total) by mouth daily.   atenolol 25 MG tablet Commonly known as:  TENORMIN Take 25 mg by mouth daily.   ciprofloxacin 500 MG tablet Commonly known as:  CIPRO Take 1 tablet (500 mg total) by mouth 2 (two) times daily.   ezetimibe 10 MG tablet Commonly known as:  ZETIA Take 10 mg by mouth daily.   omeprazole 20 MG capsule Commonly known as:  PRILOSEC Take 20 mg by mouth daily.   polycarbophil 625 MG tablet Commonly known as:  FIBERCON Take 1,250 mg by mouth daily.       Followup:  Follow-up Information    Jed Limerick, NP On 02/20/2019.   Specialty:  Urology Why:  2:45 Contact information: [address] [place] [address] [phone]

## 2019-02-07 NOTE — Discharge Instructions (Signed)
Transurethral Resection of the Prostate (TURP) or Greenlight laser ablation of the Prostate ° °Care After ° °Refer to this sheet in the next few weeks. These discharge instructions provide you with general information on caring for yourself after you leave the hospital. Your caregiver may also give you specific instructions. Your treatment has been planned according to the most current medical practices available, but unavoidable complications sometimes occur. If you have any problems or questions after discharge, please call your caregiver. ° °HOME CARE INSTRUCTIONS  ° °Medications °· You may receive medicine for pain management. As your level of discomfort decreases, adjustments in your pain medicines may be made.  °· Take all medicines as directed.  °· You may be given a medicine (antibiotic) to kill germs following surgery. Finish all medicines. Let your caregiver know if you have any side effects or problems from the medicine.  °· If you are on aspirin, it would be best not to restart the aspirin until the blood in the urine clears °Hygiene °· You can take a shower after surgery.  °· You should not take a bath while you still have the urethral catheter. °Activity °· You will be encouraged to get out of bed as much as possible and increase your activity level as tolerated.  °· Spend the first week in and around your home. For 3 weeks, avoid the following:  °· Straining.  °· Running.  °· Strenuous work.  °· Walks longer than a few blocks.  °· Riding for extended periods.  °· Sexual relations.  °· Do not lift heavy objects (more than 20 pounds) for at least 1 month. When lifting, use your arms instead of your abdominal muscles.  °· You will be encouraged to walk as tolerated. Do not exert yourself. Increase your activity level slowly. Remember that it is important to keep moving after an operation of any type. This cuts down on the possibility of developing blood clots.  °· Your caregiver will tell you when you  can resume driving and light housework. Discuss this at your first office visit after discharge. °Diet °· No special diet is ordered after a TURP. However, if you are on a special diet for another medical problem, it should be continued.  °· Normal fluid intake is usually recommended.  °· Avoid alcohol and caffeinated drinks for 2 weeks. They irritate the bladder. Decaffeinated drinks are okay.  °· Avoid spicy foods.  °Bladder Function °· For the first 10 days, empty the bladder whenever you feel a definite desire. Do not try to hold the urine for long periods of time.  °· Urinating once or twice a night even after you are healed is not uncommon.  °· You may see some recurrence of blood in the urine after discharge from the hospital. This usually happens within 2 weeks after the procedure.If this occurs, force fluids again as you did in the hospital and reduce your activity.  °Bowel Function °· You may experience some constipation after surgery. This can be minimized by increasing fluids and fiber in your diet. Drink enough water and fluids to keep your urine clear or pale yellow.  °· A stool softener may be prescribed for use at home. Do not strain to move your bowels.  °· If you are requiring increased pain medicine, it is important that you take stool softeners to prevent constipation. This will help to promote proper healing by reducing the need to strain to move your bowels.  °Sexual Activity °· Semen movement   after intercourse. Or, you may not have an ejaculation during erection. Ask your caregiver when you can resume sexual activity. Retrograde ejaculation and reduced semen discharge should not reduce one's pleasure of intercourse.  Postoperative Visit Arrange the date and time of your after surgery visit with your caregiver.  Return to Work After your recovery is complete, you will  be able to return to work and resume all activities. Your caregiver will inform you when you can return to work.  

## 2019-02-08 ENCOUNTER — Emergency Department (HOSPITAL_COMMUNITY)
Admission: EM | Admit: 2019-02-08 | Discharge: 2019-02-08 | Disposition: A | Discharge status: Home / Self Care | Payer: Medicare HMO | Attending: Emergency Medicine | Admitting: Emergency Medicine

## 2019-02-08 ENCOUNTER — Encounter (HOSPITAL_COMMUNITY): Payer: Self-pay | Admitting: *Deleted

## 2019-02-08 ENCOUNTER — Other Ambulatory Visit: Payer: Self-pay

## 2019-02-08 DIAGNOSIS — Z951 Presence of aortocoronary bypass graft: Secondary | ICD-10-CM | POA: Diagnosis not present

## 2019-02-08 DIAGNOSIS — Z87891 Personal history of nicotine dependence: Secondary | ICD-10-CM | POA: Diagnosis not present

## 2019-02-08 DIAGNOSIS — I252 Old myocardial infarction: Secondary | ICD-10-CM | POA: Diagnosis not present

## 2019-02-08 DIAGNOSIS — I2510 Atherosclerotic heart disease of native coronary artery without angina pectoris: Secondary | ICD-10-CM | POA: Insufficient documentation

## 2019-02-08 DIAGNOSIS — Z7982 Long term (current) use of aspirin: Secondary | ICD-10-CM | POA: Insufficient documentation

## 2019-02-08 DIAGNOSIS — Z79899 Other long term (current) drug therapy: Secondary | ICD-10-CM | POA: Insufficient documentation

## 2019-02-08 DIAGNOSIS — R339 Retention of urine, unspecified: Secondary | ICD-10-CM | POA: Diagnosis not present

## 2019-02-08 DIAGNOSIS — N401 Enlarged prostate with lower urinary tract symptoms: Secondary | ICD-10-CM | POA: Diagnosis not present

## 2019-02-08 LAB — URINALYSIS, ROUTINE W REFLEX MICROSCOPIC
BILIRUBIN URINE: NEGATIVE
Glucose, UA: NEGATIVE mg/dL
Ketones, ur: NEGATIVE mg/dL
Nitrite: NEGATIVE
Protein, ur: 30 mg/dL — AB
RBC / HPF: >50 RBC/hpf — ABNORMAL HIGH (ref 0–5)
Specific Gravity, Urine: 1.01 (ref 1.005–1.030)
pH: 6 (ref 5.0–8.0)

## 2019-02-08 NOTE — ED Provider Notes (Signed)
[place] DEPT Provider Note   CSN: [id number] Arrival date & time: 02/08/19  9562     History   Chief Complaint Chief Complaint  Patient presents with  . Urinary Retention    HPI [name] is a 85 y.o. male.  Patient presents to the emergency department for evaluation of urinary retention.  Patient reports that he had transurethral resection of his prostate 2 days ago.  He reports that yesterday evening he started to notice decreased urine output and overnight he has not been able to urinate at all.  He has noticed blood in his urine when he is able to pass it.  He has not passed any clots.  Patient reporting progressively worsening bladder pain.     Past Medical History:  Diagnosis Date  . Barrett's esophagus 01/25/2017  . Coronary artery disease   . Dysphagia 01/25/2017   hx of  . Fecal incontinence    small amount leakage  . Foley catheter in place    changed Dec 31 2018  . Hyperlipidemia   . Myocardial infarction [facility]) 1996or 1997  . Urinary retention     Patient Active Problem List   Diagnosis Date Noted  . BPH with urinary obstruction 02/06/2019  . Dysphagia 01/25/2017  . Barrett's esophagus 01/25/2017  . Esophageal dysphagia 01/25/2017  . Barrett's esophagus with low grade dysplasia 01/25/2017  . Hyperlipidemia 02/13/2012  . Fecal incontinence 02/13/2012  . CAD (coronary artery disease) 02/13/2012    Past Surgical History:  Procedure Laterality Date  . BACK SURGERY     lower  . cataract surgery     bilateral  . CORONARY ARTERY BYPASS GRAFT     15 yrs ago x 5   . ESOPHAGEAL DILATION N/A 02/17/2017   Procedure: ESOPHAGEAL DILATION;  Surgeon: Rogene Houston, MD;  [facility];  Service: Endoscopy;  Laterality: N/A;  . ESOPHAGOGASTRODUODENOSCOPY N/A 02/17/2017   Procedure: ESOPHAGOGASTRODUODENOSCOPY (EGD);  Surgeon: Rogene Houston, MD;  Location: [facility];  Service: Endoscopy;  Laterality: N/A;   12:45  . ROTATOR CUFF REPAIR     Left  . TRANSURETHRAL RESECTION OF PROSTATE N/A 02/06/2019   Procedure: TRANSURETHRAL RESECTION OF THE PROSTATE (TURP);  Surgeon: [name], MD;  Location: [facility] ORS;  Service: Urology;  Laterality: N/A;        Home Medications    Prior to Admission medications   Medication Sig Start Date End Date Taking? Authorizing Provider  acetaminophen (TYLENOL) 500 MG tablet Take 1,000 mg by mouth every 6 (six) hours as needed for moderate pain or headache.     [provider]  aspirin 325 MG EC tablet Take 1 tablet (325 mg total) by mouth daily. 02/20/17   Rogene Houston, MD  atenolol (TENORMIN) 25 MG tablet Take 25 mg by mouth daily.      [provider]  ciprofloxacin (CIPRO) 500 MG tablet Take 1 tablet (500 mg total) by mouth 2 (two) times daily. 02/07/19   [name], MD  ezetimibe (ZETIA) 10 MG tablet Take 10 mg by mouth daily.      [provider]  omeprazole (PRILOSEC) 20 MG capsule Take 20 mg by mouth daily.      [provider]  polycarbophil (FIBERCON) 625 MG tablet Take 1,250 mg by mouth daily.     [provider]    Family History No family history on file.  Social History Social History   Tobacco Use  .  Smoking status: Former Research scientist (life sciences)  . Smokeless tobacco: Former Systems developer  . Tobacco comment: quit more than 20 years ago  Substance Use Topics  . Alcohol use: No  . Drug use: No     Allergies   Patient has no known allergies.   Review of Systems Review of Systems  Genitourinary: Positive for decreased urine volume and difficulty urinating. Negative for flank pain.  All other systems reviewed and are negative.    Physical Exam Updated Vital Signs BP (!) 121/54   Pulse 68   Temp 98.8 F (37.1 C) (Oral)   Resp 18   Ht 5\' 10"  (1.778 m)   Wt 67.2 kg   SpO2 96%   BMI 21.25 kg/m   Physical Exam Vitals signs and nursing note reviewed.  Constitutional:      General: He is not  in acute distress.    Appearance: Normal appearance. He is well-developed.  HENT:     Head: Normocephalic and atraumatic.     Right Ear: Hearing normal.     Left Ear: Hearing normal.     Nose: Nose normal.  Eyes:     Conjunctiva/sclera: Conjunctivae normal.     Pupils: Pupils are equal, round, and reactive to light.  Neck:     Musculoskeletal: Normal range of motion and neck supple.  Cardiovascular:     Rate and Rhythm: Regular rhythm.     Heart sounds: S1 normal and S2 normal. No murmur. No friction rub. No gallop.   Pulmonary:     Effort: Pulmonary effort is normal. No respiratory distress.     Breath sounds: Normal breath sounds.  Chest:     Chest wall: No tenderness.  Abdominal:     General: Bowel sounds are normal.     Palpations: Abdomen is soft.     Tenderness: There is abdominal tenderness (Tender, distended bladder) in the suprapubic area. There is no guarding or rebound. Negative signs include Murphy's sign and McBurney's sign.     Hernia: No hernia is present.  Musculoskeletal: Normal range of motion.  Skin:    General: Skin is warm and dry.     Findings: No rash.  Neurological:     Mental Status: He is alert and oriented to person, place, and time.     GCS: GCS eye subscore is 4. GCS verbal subscore is 5. GCS motor subscore is 6.     Cranial Nerves: No cranial nerve deficit.     Sensory: No sensory deficit.     Coordination: Coordination normal.  Psychiatric:        Speech: Speech normal.        Behavior: Behavior normal.        Thought Content: Thought content normal.      ED Treatments / Results  Labs (all labs ordered are listed, but only abnormal results are displayed) Labs Reviewed  URINALYSIS, ROUTINE W REFLEX MICROSCOPIC - Abnormal; Notable for the following components:      Result Value   APPearance HAZY (*)    Hgb urine dipstick LARGE (*)    Protein, ur 30 (*)    Leukocytes,Ua SMALL (*)    RBC / HPF >50 (*)    WBC, UA >50 (*)    Bacteria, UA  RARE (*)    All other components within normal limits    EKG None  Radiology No results found.  Procedures Procedures (including critical care time)  Medications Ordered in ED Medications - No data to display  Initial Impression / Assessment and Plan / ED Course  I have reviewed the triage vital signs and the nursing notes.  Pertinent labs & imaging results that were available during my care of the patient were reviewed by me and considered in my medical decision making (see chart for details).     Patient presents with acute urinary retention 2 days after TURP.  He does not have a Foley catheter in place.  He has noticed blood in the urine.  A catheter was placed.  Patient initially had blood and some clots, now has cleared.  He has a follow-up appointment with his urologist in 2 hours.  Will be discharged with Foley catheter in place.  Final Clinical Impressions(s) / ED Diagnoses   Final diagnoses:  Urinary retention    ED Discharge Orders    None       Orpah Greek, MD 02/08/19 [phone]

## 2019-02-08 NOTE — ED Notes (Signed)
Bed: AR01 Expected date:  Expected time:  Means of arrival:  Comments: Tr 5 - [place]

## 2019-02-08 NOTE — ED Triage Notes (Signed)
Pt stated "I had a procedure on my prostate on Wed and was able to pee.  I've had a catheter for 2 months before that.  After I got home I was able to pee a couple of times but then it stopped."

## 2019-02-13 DIAGNOSIS — R339 Retention of urine, unspecified: Secondary | ICD-10-CM | POA: Diagnosis not present

## 2019-02-14 DIAGNOSIS — Z6823 Body mass index (BMI) 23.0-23.9, adult: Secondary | ICD-10-CM | POA: Diagnosis not present

## 2019-02-14 DIAGNOSIS — N401 Enlarged prostate with lower urinary tract symptoms: Secondary | ICD-10-CM | POA: Diagnosis not present

## 2019-02-14 DIAGNOSIS — E1169 Type 2 diabetes mellitus with other specified complication: Secondary | ICD-10-CM | POA: Diagnosis not present

## 2019-02-14 DIAGNOSIS — E785 Hyperlipidemia, unspecified: Secondary | ICD-10-CM | POA: Diagnosis not present

## 2019-02-19 ENCOUNTER — Encounter (INDEPENDENT_AMBULATORY_CARE_PROVIDER_SITE_OTHER): Payer: Self-pay | Admitting: Internal Medicine

## 2019-02-19 ENCOUNTER — Ambulatory Visit (INDEPENDENT_AMBULATORY_CARE_PROVIDER_SITE_OTHER): Payer: Medicare HMO | Admitting: Internal Medicine

## 2019-02-19 VITALS — BP 136/67 | HR 57 | Temp 97.9°F | Ht 70.0 in | Wt 152.3 lb

## 2019-02-19 DIAGNOSIS — K2270 Barrett's esophagus without dysplasia: Secondary | ICD-10-CM

## 2019-02-19 DIAGNOSIS — R1310 Dysphagia, unspecified: Secondary | ICD-10-CM

## 2019-02-19 DIAGNOSIS — R1319 Other dysphagia: Secondary | ICD-10-CM

## 2019-02-19 DIAGNOSIS — K219 Gastro-esophageal reflux disease without esophagitis: Secondary | ICD-10-CM | POA: Diagnosis not present

## 2019-02-19 NOTE — Patient Instructions (Signed)
Continue the Omeprazole.  OV in 1 year.  

## 2019-02-19 NOTE — Progress Notes (Signed)
Subjective:    Patient ID: [name], male    DOB: 1934/03/07, 85 y.o.   MRN: [id number]  HPI Here today for 1 year follow. Last seen in February of 2019. Hx of dysphagia. Hx of Barrett's esophagus.  He denies any dysphagia.  Appetite is good. H has lost about 7 pounds since his last visit. ? From his recent surgery. GERD controlled with Omeprazole.  His BMs are mopving normal.    Recent hx of TURP 02/06/2019 Dr. Jillyn Hidden.   EGD 02/17/2017 Impression: - Web in the proximal esophagus. - Low-grade of narrowing Schatzki ring. Dilated. - Esophageal mucosal changes secondary to  established short-segment Barrett's disease.  Biopsied. - 6 cm hiatal hernia. - A few gastric polyps. - Normal duodenal bulb and second portion of the   duodenum.  Esophageal biopsy shows changes of reflux esophagitis but no Barrett's.    EGD/Colonosopy in 2011.A 4cm sized sliding hiatal hernia. Small patches of pink or salmon-colored mucosa which were biopsied. Normal exam of stomach,1st and 2nd part of duodenum. Normal colonoscopy except for 2 diverticula at sigmoid colon.  Biopsy: Barrett's esophagus with low grade dysplasia. No neoplasm identified.   Review of Systems Past Medical History:  Diagnosis Date  . Barrett's esophagus 01/25/2017  . Coronary artery disease   . Dysphagia 01/25/2017   hx of  . Fecal incontinence    small amount leakage  . Foley catheter in place    changed Dec 31 2018  . Hyperlipidemia   . Myocardial infarction [facility]) 1996or 1997  . Urinary retention     Past Surgical History:  Procedure Laterality Date  . BACK SURGERY     lower  . cataract surgery     bilateral  . CORONARY ARTERY BYPASS GRAFT     15 yrs ago x 5   . ESOPHAGEAL DILATION N/A  02/17/2017   Procedure: ESOPHAGEAL DILATION;  Surgeon: Rogene Houston, MD;  [facility];  Service: Endoscopy;  Laterality: N/A;  . ESOPHAGOGASTRODUODENOSCOPY N/A 02/17/2017   Procedure: ESOPHAGOGASTRODUODENOSCOPY (EGD);  Surgeon: Rogene Houston, MD;  [facility];  Service: Endoscopy;  Laterality: N/A;  12:45  . ROTATOR CUFF REPAIR     Left  . TRANSURETHRAL RESECTION OF PROSTATE N/A 02/06/2019   Procedure: TRANSURETHRAL RESECTION OF THE PROSTATE (TURP);  Surgeon: [name], MD;  Location: [facility] OR[facility];  Service: Urology;  Laterality: N/A;    No Known Allergies  Current Outpatient Medications on File Prior to Visit  Medication Sig Dispense Refill  . acetaminophen (TYLENOL) 500 MG tablet Take 1,000 mg by mouth every 6 (six) hours as needed for moderate pain or headache.     Marland Kitchen atenolol (TENORMIN) 25 MG tablet Take 25 mg by mouth daily.      Marland Kitchen ezetimibe (ZETIA) 10 MG tablet Take 10 mg by mouth daily.      Marland Kitchen omeprazole (PRILOSEC) 20 MG capsule Take 20 mg by mouth daily.      . polycarbophil (FIBERCON) 625 MG tablet Take 1,250 mg by mouth daily.      No current facility-administered medications on file prior to visit.         Objective:   Physical Exam Blood pressure 136/67, pulse (!) 57, temperature 97.9 F (36.6 C), height 5\' 10"  (1.778 m), weight 152 lb 4.8 oz (69.1 kg). Alert and oriented. Skin warm and dry. Oral mucosa is moist.   . Sclera anicteric, conjunctivae is pink. Thyroid not enlarged. No cervical lymphadenopathy.  Lungs clear. Heart regular rate and rhythm.  Abdomen is soft. Bowel sounds are positive. No hepatomegaly. No abdominal masses felt. No tenderness.  No edema to lower extremities.           Assessment & Plan:  GERD. Continue the Omeprazole Dysphagia.  Barrett's esophagus. Will need surveillance 2025.

## 2019-02-20 DIAGNOSIS — N401 Enlarged prostate with lower urinary tract symptoms: Secondary | ICD-10-CM | POA: Diagnosis not present

## 2019-02-20 DIAGNOSIS — R339 Retention of urine, unspecified: Secondary | ICD-10-CM | POA: Diagnosis not present

## 2019-02-27 DIAGNOSIS — R351 Nocturia: Secondary | ICD-10-CM | POA: Diagnosis not present

## 2019-02-27 DIAGNOSIS — N401 Enlarged prostate with lower urinary tract symptoms: Secondary | ICD-10-CM | POA: Diagnosis not present

## 2019-02-27 DIAGNOSIS — R310 Gross hematuria: Secondary | ICD-10-CM | POA: Diagnosis not present

## 2019-03-07 DIAGNOSIS — R69 Illness, unspecified: Secondary | ICD-10-CM | POA: Diagnosis not present

## 2019-03-08 DIAGNOSIS — L821 Other seborrheic keratosis: Secondary | ICD-10-CM | POA: Diagnosis not present

## 2019-03-08 DIAGNOSIS — D1801 Hemangioma of skin and subcutaneous tissue: Secondary | ICD-10-CM | POA: Diagnosis not present

## 2019-03-08 DIAGNOSIS — L400 Psoriasis vulgaris: Secondary | ICD-10-CM | POA: Diagnosis not present

## 2019-04-08 DIAGNOSIS — R339 Retention of urine, unspecified: Secondary | ICD-10-CM | POA: Diagnosis not present

## 2019-04-08 DIAGNOSIS — R310 Gross hematuria: Secondary | ICD-10-CM | POA: Diagnosis not present

## 2019-04-09 DIAGNOSIS — R21 Rash and other nonspecific skin eruption: Secondary | ICD-10-CM | POA: Diagnosis not present

## 2019-04-09 DIAGNOSIS — L409 Psoriasis, unspecified: Secondary | ICD-10-CM | POA: Diagnosis not present

## 2019-04-09 DIAGNOSIS — L400 Psoriasis vulgaris: Secondary | ICD-10-CM | POA: Diagnosis not present

## 2019-04-09 DIAGNOSIS — C44629 Squamous cell carcinoma of skin of left upper limb, including shoulder: Secondary | ICD-10-CM | POA: Diagnosis not present

## 2019-04-09 DIAGNOSIS — D2239 Melanocytic nevi of other parts of face: Secondary | ICD-10-CM | POA: Diagnosis not present

## 2019-04-09 DIAGNOSIS — D485 Neoplasm of uncertain behavior of skin: Secondary | ICD-10-CM | POA: Diagnosis not present

## 2019-04-09 DIAGNOSIS — C44329 Squamous cell carcinoma of skin of other parts of face: Secondary | ICD-10-CM | POA: Diagnosis not present

## 2019-04-22 DIAGNOSIS — Z85828 Personal history of other malignant neoplasm of skin: Secondary | ICD-10-CM | POA: Diagnosis not present

## 2019-04-22 DIAGNOSIS — C44329 Squamous cell carcinoma of skin of other parts of face: Secondary | ICD-10-CM | POA: Diagnosis not present

## 2019-04-29 DIAGNOSIS — L570 Actinic keratosis: Secondary | ICD-10-CM | POA: Diagnosis not present

## 2019-05-27 DIAGNOSIS — L821 Other seborrheic keratosis: Secondary | ICD-10-CM | POA: Diagnosis not present

## 2019-05-27 DIAGNOSIS — L300 Nummular dermatitis: Secondary | ICD-10-CM | POA: Diagnosis not present

## 2019-05-27 DIAGNOSIS — Z85828 Personal history of other malignant neoplasm of skin: Secondary | ICD-10-CM | POA: Diagnosis not present

## 2019-06-24 ENCOUNTER — Other Ambulatory Visit: Payer: Self-pay | Admitting: Internal Medicine

## 2019-06-24 ENCOUNTER — Other Ambulatory Visit: Payer: Self-pay

## 2019-06-24 DIAGNOSIS — Z20822 Contact with and (suspected) exposure to covid-19: Secondary | ICD-10-CM

## 2019-06-24 DIAGNOSIS — R6889 Other general symptoms and signs: Secondary | ICD-10-CM | POA: Diagnosis not present

## 2019-06-28 ENCOUNTER — Telehealth: Payer: Self-pay | Admitting: Internal Medicine

## 2019-06-28 LAB — NOVEL CORONAVIRUS, NAA: SARS-CoV-2, NAA: NOT DETECTED

## 2019-06-28 NOTE — Telephone Encounter (Signed)
Pt called in gave neg results, pt expressed understand

## 2019-07-30 DIAGNOSIS — L400 Psoriasis vulgaris: Secondary | ICD-10-CM | POA: Diagnosis not present

## 2019-07-30 DIAGNOSIS — Z85828 Personal history of other malignant neoplasm of skin: Secondary | ICD-10-CM | POA: Diagnosis not present

## 2019-08-08 DIAGNOSIS — E785 Hyperlipidemia, unspecified: Secondary | ICD-10-CM | POA: Diagnosis not present

## 2019-08-08 DIAGNOSIS — N400 Enlarged prostate without lower urinary tract symptoms: Secondary | ICD-10-CM | POA: Diagnosis not present

## 2019-08-08 DIAGNOSIS — Z79899 Other long term (current) drug therapy: Secondary | ICD-10-CM | POA: Diagnosis not present

## 2019-08-08 DIAGNOSIS — E118 Type 2 diabetes mellitus with unspecified complications: Secondary | ICD-10-CM | POA: Diagnosis not present

## 2019-08-15 DIAGNOSIS — M791 Myalgia, unspecified site: Secondary | ICD-10-CM | POA: Diagnosis not present

## 2019-08-15 DIAGNOSIS — E785 Hyperlipidemia, unspecified: Secondary | ICD-10-CM | POA: Diagnosis not present

## 2019-08-15 DIAGNOSIS — E875 Hyperkalemia: Secondary | ICD-10-CM | POA: Diagnosis not present

## 2019-08-15 DIAGNOSIS — N401 Enlarged prostate with lower urinary tract symptoms: Secondary | ICD-10-CM | POA: Diagnosis not present

## 2019-08-15 DIAGNOSIS — E1129 Type 2 diabetes mellitus with other diabetic kidney complication: Secondary | ICD-10-CM | POA: Diagnosis not present

## 2019-08-15 DIAGNOSIS — I2510 Atherosclerotic heart disease of native coronary artery without angina pectoris: Secondary | ICD-10-CM | POA: Diagnosis not present

## 2019-08-19 DIAGNOSIS — R69 Illness, unspecified: Secondary | ICD-10-CM | POA: Diagnosis not present

## 2019-09-04 DIAGNOSIS — R69 Illness, unspecified: Secondary | ICD-10-CM | POA: Diagnosis not present

## 2019-09-25 DIAGNOSIS — R69 Illness, unspecified: Secondary | ICD-10-CM | POA: Diagnosis not present

## 2019-10-08 DIAGNOSIS — Z23 Encounter for immunization: Secondary | ICD-10-CM | POA: Diagnosis not present

## 2020-01-08 DIAGNOSIS — L821 Other seborrheic keratosis: Secondary | ICD-10-CM | POA: Diagnosis not present

## 2020-01-08 DIAGNOSIS — L400 Psoriasis vulgaris: Secondary | ICD-10-CM | POA: Diagnosis not present

## 2020-01-08 DIAGNOSIS — Z85828 Personal history of other malignant neoplasm of skin: Secondary | ICD-10-CM | POA: Diagnosis not present

## 2020-01-08 DIAGNOSIS — D1801 Hemangioma of skin and subcutaneous tissue: Secondary | ICD-10-CM | POA: Diagnosis not present

## 2020-01-08 DIAGNOSIS — L570 Actinic keratosis: Secondary | ICD-10-CM | POA: Diagnosis not present

## 2020-01-16 DIAGNOSIS — Z961 Presence of intraocular lens: Secondary | ICD-10-CM | POA: Diagnosis not present

## 2020-01-16 DIAGNOSIS — H52203 Unspecified astigmatism, bilateral: Secondary | ICD-10-CM | POA: Diagnosis not present

## 2020-01-16 DIAGNOSIS — E119 Type 2 diabetes mellitus without complications: Secondary | ICD-10-CM | POA: Diagnosis not present

## 2020-01-16 DIAGNOSIS — H0100A Unspecified blepharitis right eye, upper and lower eyelids: Secondary | ICD-10-CM | POA: Diagnosis not present

## 2020-01-16 DIAGNOSIS — Z0100 Encounter for examination of eyes and vision without abnormal findings: Secondary | ICD-10-CM | POA: Diagnosis not present

## 2020-01-20 DIAGNOSIS — Z0120 Encounter for dental examination and cleaning without abnormal findings: Secondary | ICD-10-CM | POA: Diagnosis not present

## 2020-02-10 DIAGNOSIS — I2510 Atherosclerotic heart disease of native coronary artery without angina pectoris: Secondary | ICD-10-CM | POA: Diagnosis not present

## 2020-02-10 DIAGNOSIS — E785 Hyperlipidemia, unspecified: Secondary | ICD-10-CM | POA: Diagnosis not present

## 2020-02-10 DIAGNOSIS — E1129 Type 2 diabetes mellitus with other diabetic kidney complication: Secondary | ICD-10-CM | POA: Diagnosis not present

## 2020-02-10 DIAGNOSIS — E875 Hyperkalemia: Secondary | ICD-10-CM | POA: Diagnosis not present

## 2020-02-17 DIAGNOSIS — N1831 Chronic kidney disease, stage 3a: Secondary | ICD-10-CM | POA: Diagnosis not present

## 2020-02-17 DIAGNOSIS — E1122 Type 2 diabetes mellitus with diabetic chronic kidney disease: Secondary | ICD-10-CM | POA: Diagnosis not present

## 2020-02-17 DIAGNOSIS — E875 Hyperkalemia: Secondary | ICD-10-CM | POA: Diagnosis not present

## 2020-02-20 ENCOUNTER — Ambulatory Visit (INDEPENDENT_AMBULATORY_CARE_PROVIDER_SITE_OTHER): Payer: Medicare HMO | Admitting: Nurse Practitioner

## 2020-02-25 IMAGING — DX DG CHEST 2V
2 series · 2 of 2 positions shown · non-contrast
Comparison: 04/14/2014

CLINICAL DATA: Left rib pain

EXAM:
CHEST - 2 VIEW

[chest pa]
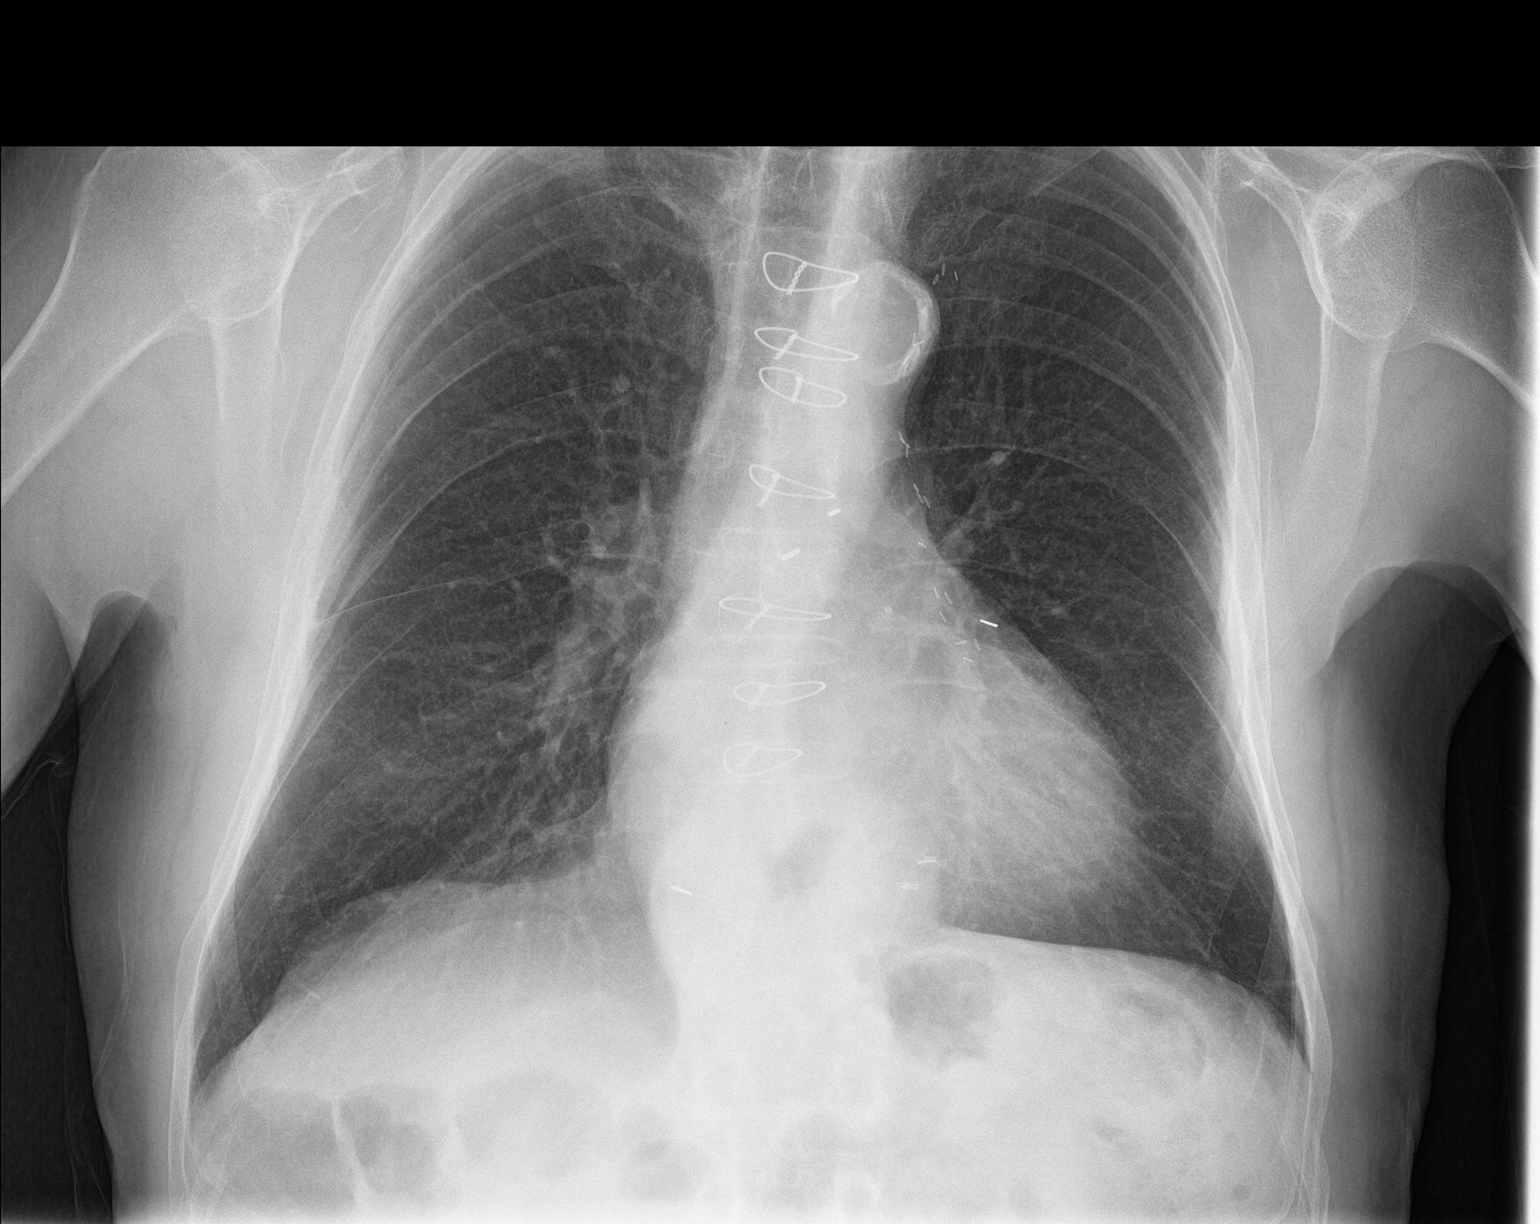

[chest lat]
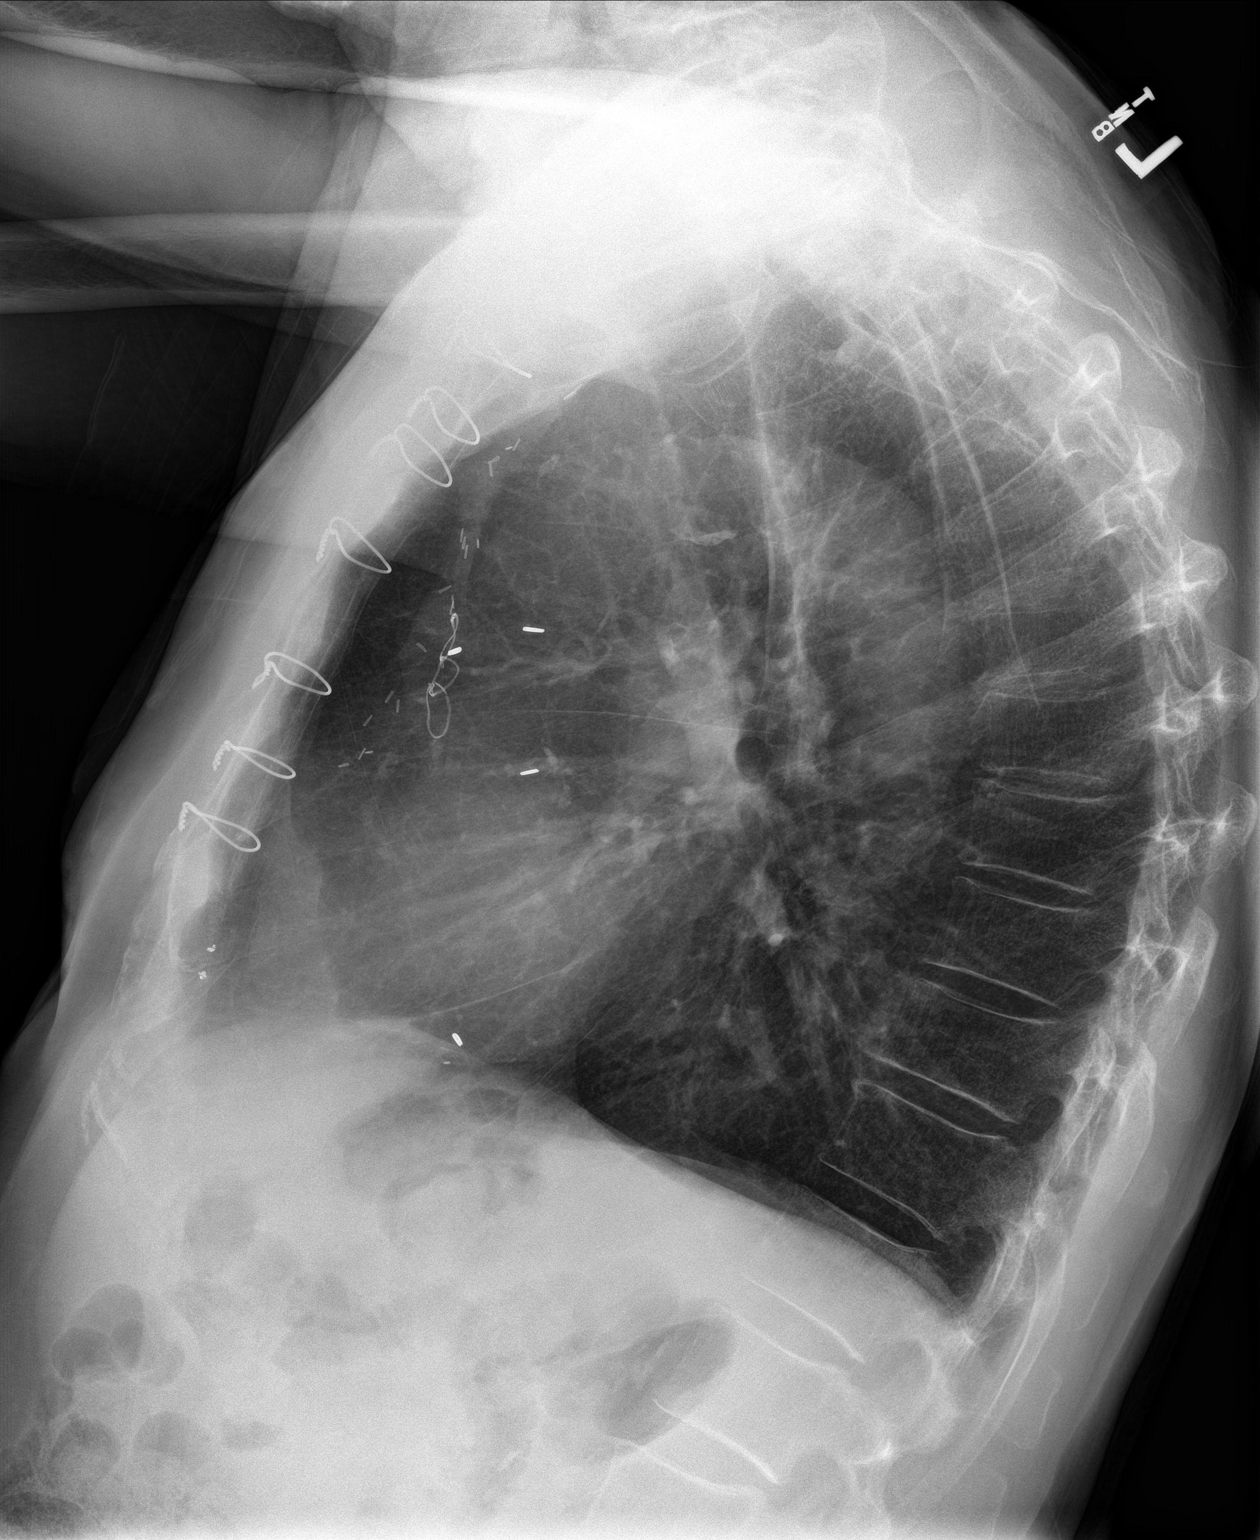

[2 of 2 positions shown; findings below may reference images not displayed]

FINDINGS: CABG. Heart size upper normal without heart failure. Lungs are clear
without difficult trait or effusion.

Hiatal hernia.  Atherosclerotic aortic arch
IMPRESSION: No active cardiopulmonary disease.

## 2020-03-21 DIAGNOSIS — Z20828 Contact with and (suspected) exposure to other viral communicable diseases: Secondary | ICD-10-CM | POA: Diagnosis not present

## 2020-04-14 DIAGNOSIS — L821 Other seborrheic keratosis: Secondary | ICD-10-CM | POA: Diagnosis not present

## 2020-04-14 DIAGNOSIS — D485 Neoplasm of uncertain behavior of skin: Secondary | ICD-10-CM | POA: Diagnosis not present

## 2020-04-14 DIAGNOSIS — L404 Guttate psoriasis: Secondary | ICD-10-CM | POA: Diagnosis not present

## 2020-04-14 DIAGNOSIS — L570 Actinic keratosis: Secondary | ICD-10-CM | POA: Diagnosis not present

## 2020-04-14 DIAGNOSIS — D1801 Hemangioma of skin and subcutaneous tissue: Secondary | ICD-10-CM | POA: Diagnosis not present

## 2020-04-14 DIAGNOSIS — Z85828 Personal history of other malignant neoplasm of skin: Secondary | ICD-10-CM | POA: Diagnosis not present

## 2020-04-14 DIAGNOSIS — L308 Other specified dermatitis: Secondary | ICD-10-CM | POA: Diagnosis not present

## 2020-05-04 ENCOUNTER — Ambulatory Visit (INDEPENDENT_AMBULATORY_CARE_PROVIDER_SITE_OTHER): Payer: Medicare HMO | Admitting: Gastroenterology

## 2020-05-18 ENCOUNTER — Other Ambulatory Visit: Payer: Self-pay

## 2020-05-18 ENCOUNTER — Ambulatory Visit (INDEPENDENT_AMBULATORY_CARE_PROVIDER_SITE_OTHER): Payer: Medicare HMO | Admitting: Gastroenterology

## 2020-05-18 ENCOUNTER — Encounter (INDEPENDENT_AMBULATORY_CARE_PROVIDER_SITE_OTHER): Payer: Self-pay | Admitting: Gastroenterology

## 2020-05-18 VITALS — BP 156/74 | HR 60 | Temp 97.2°F | Ht 70.0 in | Wt 154.9 lb

## 2020-05-18 DIAGNOSIS — K219 Gastro-esophageal reflux disease without esophagitis: Secondary | ICD-10-CM | POA: Diagnosis not present

## 2020-05-18 DIAGNOSIS — R159 Full incontinence of feces: Secondary | ICD-10-CM

## 2020-05-18 MED ORDER — OMEPRAZOLE 20 MG PO CPDR: 20.0000 mg | DELAYED_RELEASE_CAPSULE | Freq: Every day | ORAL | 3 refills | Status: DC

## 2020-05-18 NOTE — Patient Instructions (Addendum)
Try metamucil for 2 weeks as directed to try to even out stool consistency. If this does not help can add in stool softener (colace 100mg  twice a day) to prevent firm stools. Please let me know if this does not improve leakage.   Please call if trouble swallowing worsenes

## 2020-05-18 NOTE — Progress Notes (Signed)
Patient profile: [name] is a 86 y.o. male seen for evaluation of GERD . Last seen in clinic on 01/2019  History of Present Illness: [name] is seen today for yearly evaluation of GERD.  He reports his current symptoms are well controlled on omeprazole 20 mg once a day, he is not having breakthrough reflux symptoms.  He has very rare occasional dysphagia to steak, feels this does not "stick" but does go down slower.  He tolerates liquids and pills and other solids without issues.  Denies nausea vomiting.  Appetite good.  Does report having issues with leakage and fecal incontinence over the past many months, this usually occurs after a hard stool, can mess up his underclothes.  Regular bowel habits are every 2 to 3 days without significant straining but does report stools are firm.  He denies any rectal bleeding, melena, abdominal pain.  He is usually unaware leakage is occurring    Wt Readings from Last 3 Encounters:  05/18/20 154 lb 14.4 oz (70.3 kg)  02/19/19 152 lb 4.8 oz (69.1 kg)  02/08/19 148 lb 1.6 oz (67.2 kg)     EGD 02/17/2017  EGD 2018 -Impression: - Web in the proximal esophagus. - Low-grade of narrowing Schatzki ring. Dilated. - Esophageal mucosal changes secondary to established short-segment Barrett's disease.Biopsied. - 6 cm hiatal hernia. - A few gastric polyps. - Normal duodenal bulb and second portion of theduodenum. Esophageal biopsy shows changes of reflux esophagitis but no Barrett's.   EGD/Colonosopy in 2011.A 4cm sized sliding hiatal hernia. Small patches of pink or salmon-colored mucosa which were biopsied. Normal exam of stomach,1st and 2nd part of duodenum. Normal colonoscopy except for 2 diverticula at sigmoid colon.  Biopsy: Barrett's esophagus with low grade dysplasia. No neoplasm identified.   Past Medical History:  Past Medical History:  Diagnosis Date  . Barrett's esophagus 01/25/2017  . Coronary artery disease    . Dysphagia 01/25/2017   hx of  . Fecal incontinence    small amount leakage  . Foley catheter in place    changed Dec 31 2018  . Hyperlipidemia   . Myocardial infarction [facility]) 1996or 1997  . Urinary retention     Problem List: Patient Active Problem List   Diagnosis Date Noted  . BPH with urinary obstruction 02/06/2019  . Dysphagia 01/25/2017  . Barrett's esophagus 01/25/2017  . Esophageal dysphagia 01/25/2017  . Barrett's esophagus with low grade dysplasia 01/25/2017  . Hyperlipidemia 02/13/2012  . Fecal incontinence 02/13/2012  . CAD (coronary artery disease) 02/13/2012    Past Surgical History: Past Surgical History:  Procedure Laterality Date  . BACK SURGERY     lower  . cataract surgery     bilateral  . CORONARY ARTERY BYPASS GRAFT     15 yrs ago x 5   . ESOPHAGEAL DILATION N/A 02/17/2017   Procedure: ESOPHAGEAL DILATION;  Surgeon: Rogene Houston, MD;  [facility];  Service: Endoscopy;  Laterality: N/A;  . ESOPHAGOGASTRODUODENOSCOPY N/A 02/17/2017   Procedure: ESOPHAGOGASTRODUODENOSCOPY (EGD);  Surgeon: Rogene Houston, MD;  [facility];  Service: Endoscopy;  Laterality: N/A;  12:45  . ROTATOR CUFF REPAIR     Left  . TRANSURETHRAL RESECTION OF PROSTATE N/A 02/06/2019   Procedure: TRANSURETHRAL RESECTION OF THE PROSTATE (TURP);  Surgeon: [name], MD;  Location: [facility];  Service: Urology;  Laterality: N/A;    Allergies: No Known Allergies    Home Medications:  Current Outpatient Medications:  .  acetaminophen (TYLENOL) 500 MG tablet, Take 1,000 mg by mouth every 6 (six) hours as needed for moderate pain or headache. , Disp: , Rfl:  .  aspirin 325 MG EC tablet, Take 325 mg by mouth daily., Disp: , Rfl:  .  atenolol (TENORMIN) 25 MG tablet, Take 25 mg by mouth daily.  , Disp: , Rfl:  .  ezetimibe (ZETIA) 10 MG tablet, Take 10 mg by mouth daily.  , Disp: , Rfl:  .  Methylcellulose, Laxative, (FIBER THERAPY PO), Take 625 mg by  mouth. Patient takes 3 per day., Disp: , Rfl:  .  omeprazole (PRILOSEC) 20 MG capsule, Take 1 capsule (20 mg total) by mouth daily. Take 30 min before breakfast, Disp: 90 capsule, Rfl: 3   Family History: Denies risk relatives with colon polyps or colon cancer  Social History:   reports that he has quit smoking. He has quit using smokeless tobacco. He reports that he does not drink alcohol or use drugs.   Review of Systems: Constitutional: Denies weight loss/weight gain  Eyes: No changes in vision. ENT: No oral lesions, sore throat.  GI: see HPI.  Heme/Lymph: No easy bruising.  CV: No chest pain.  GU: No hematuria.  Integumentary: No rashes.  Neuro: No headaches.  Psych: No depression/anxiety.  Endocrine: No heat/cold intolerance.  Allergic/Immunologic: No urticaria.  Resp: No cough, SOB.  Musculoskeletal: No joint swelling.    Physical Examination: BP (!) 156/74 (BP Location: Right Arm, Patient Position: Sitting, Cuff Size: Large)   Pulse 60   Temp (!) 97.2 F (36.2 C) (Temporal)   Ht 5\' 10"  (1.778 m)   Wt 154 lb 14.4 oz (70.3 kg)   BMI 22.23 kg/m  Gen: NAD, alert and oriented x 4 HEENT: PEERLA, EOMI, Neck: supple, no JVD Chest: CTA bilaterally, no wheezes, crackles, or other adventitious sounds CV: RRR, no m/g/c/r Abd: soft, NT, ND, +BS in all four quadrants; no HSM, guarding, ridigity, or rebound tenderness Ext: no edema, well perfused with 2+ pulses, Skin: no rash or lesions noted on observed skin Lymph: no noted LAD  Data Reviewed:  01/2019 labs reviewed - hgb 10.6, normocytic -patient reports he has upcoming labs scheduled with PCP  Assessment/Plan: Mr. Klare is a 86 y.o. male   1.  GERD-well-controlled on omeprazole 20 mg, continue PPI.  He takes aspirin 325 but takes with food daily.  2.  Fecal leakage-suspect slight degree of constipation with liquid seepage after stooling exacerbated by poor sphincter tone due to age.  Recommended fiber supplement and  if no improvement stool softener.  He will contact me if this continues.  3.  Colon cancer screening-last colonoscopy 10 years ago but will hold off given his advanced age and lack of alarm symptoms which were reviewed today and to contact if develops.  No significant family history polyps/crc.  4.  Dysphagia-reports this is extremely mild and only to steak.  He defers EGD at this time.  To contact me if worsens.  Follow-up 1 year-call sooner with issues  Wynne was seen today for follow-up.  Diagnoses and all orders for this visit:  Chronic GERD  Incontinence of feces, unspecified fecal incontinence type  Other orders -     omeprazole (PRILOSEC) 20 MG capsule; Take 1 capsule (20 mg total) by mouth daily. Take 30 min before breakfast     I personally performed the service, non-incident to. (WP)  Laurine Blazer, [facility]

## 2020-07-09 DIAGNOSIS — I70219 Atherosclerosis of native arteries of extremities with intermittent claudication, unspecified extremity: Secondary | ICD-10-CM | POA: Diagnosis not present

## 2020-07-10 ENCOUNTER — Other Ambulatory Visit (HOSPITAL_COMMUNITY): Payer: Self-pay | Admitting: Internal Medicine

## 2020-07-10 ENCOUNTER — Other Ambulatory Visit: Payer: Self-pay | Admitting: Internal Medicine

## 2020-07-10 DIAGNOSIS — I70213 Atherosclerosis of native arteries of extremities with intermittent claudication, bilateral legs: Secondary | ICD-10-CM

## 2020-07-17 ENCOUNTER — Ambulatory Visit (HOSPITAL_COMMUNITY)
Admission: RE | Admit: 2020-07-17 | Discharge: 2020-07-17 | Disposition: A | Discharge status: Home / Self Care | Payer: Medicare HMO | Source: Ambulatory Visit | Attending: Internal Medicine | Admitting: Internal Medicine

## 2020-07-17 ENCOUNTER — Other Ambulatory Visit: Payer: Self-pay

## 2020-07-17 DIAGNOSIS — I70213 Atherosclerosis of native arteries of extremities with intermittent claudication, bilateral legs: Secondary | ICD-10-CM | POA: Insufficient documentation

## 2020-07-17 DIAGNOSIS — I70219 Atherosclerosis of native arteries of extremities with intermittent claudication, unspecified extremity: Secondary | ICD-10-CM | POA: Diagnosis not present

## 2020-08-03 DIAGNOSIS — Z0120 Encounter for dental examination and cleaning without abnormal findings: Secondary | ICD-10-CM | POA: Diagnosis not present

## 2020-08-11 DIAGNOSIS — Z79899 Other long term (current) drug therapy: Secondary | ICD-10-CM | POA: Diagnosis not present

## 2020-08-11 DIAGNOSIS — N183 Chronic kidney disease, stage 3 unspecified: Secondary | ICD-10-CM | POA: Diagnosis not present

## 2020-08-11 DIAGNOSIS — E1129 Type 2 diabetes mellitus with other diabetic kidney complication: Secondary | ICD-10-CM | POA: Diagnosis not present

## 2020-08-11 DIAGNOSIS — I2510 Atherosclerotic heart disease of native coronary artery without angina pectoris: Secondary | ICD-10-CM | POA: Diagnosis not present

## 2020-08-11 DIAGNOSIS — E875 Hyperkalemia: Secondary | ICD-10-CM | POA: Diagnosis not present

## 2020-08-14 DIAGNOSIS — E785 Hyperlipidemia, unspecified: Secondary | ICD-10-CM | POA: Diagnosis not present

## 2020-08-14 DIAGNOSIS — I70219 Atherosclerosis of native arteries of extremities with intermittent claudication, unspecified extremity: Secondary | ICD-10-CM | POA: Diagnosis not present

## 2020-08-14 DIAGNOSIS — N183 Chronic kidney disease, stage 3 unspecified: Secondary | ICD-10-CM | POA: Diagnosis not present

## 2020-08-14 DIAGNOSIS — E1129 Type 2 diabetes mellitus with other diabetic kidney complication: Secondary | ICD-10-CM | POA: Diagnosis not present

## 2020-08-17 DIAGNOSIS — L400 Psoriasis vulgaris: Secondary | ICD-10-CM | POA: Diagnosis not present

## 2020-08-17 DIAGNOSIS — Z85828 Personal history of other malignant neoplasm of skin: Secondary | ICD-10-CM | POA: Diagnosis not present

## 2020-08-26 DIAGNOSIS — R69 Illness, unspecified: Secondary | ICD-10-CM | POA: Diagnosis not present

## 2020-09-07 ENCOUNTER — Other Ambulatory Visit: Payer: Self-pay

## 2020-09-07 ENCOUNTER — Encounter: Payer: Self-pay | Admitting: Vascular Surgery

## 2020-09-07 ENCOUNTER — Ambulatory Visit: Payer: Medicare HMO | Admitting: Vascular Surgery

## 2020-09-07 VITALS — BP 165/77 | HR 52 | Temp 97.7°F | Resp 14 | Ht 70.0 in | Wt 156.0 lb

## 2020-09-07 DIAGNOSIS — I70213 Atherosclerosis of native arteries of extremities with intermittent claudication, bilateral legs: Secondary | ICD-10-CM

## 2020-09-07 NOTE — Progress Notes (Signed)
Vascular and Vein Specialist of [place]  Patient name: HERMAN FIERO MRN: [id number] DOB: 1934-11-03 Sex: male  REASON FOR CONSULT: Evaluation of symptoms of intermittent claudication  HPI: DYLIN BREEDEN is a 86 y.o. male, who is here today for discussion of bilateral lower extremity claudication symptoms.  He is very active 86 year old.  He reports that he has had episodes where his calves and occasionally his thighs can have a tightening sensation that is only occurring when he is walking.  He does not have any lower extremity tissue loss.  He reports that this is actually improved since he initially had mentioned his discomfort to Dr. Willey Blade.  He reports that this is not currently limiting to him.  Past Medical History:  Diagnosis Date  . Barrett's esophagus 01/25/2017  . Coronary artery disease   . Dysphagia 01/25/2017   hx of  . Fecal incontinence    small amount leakage  . Foley catheter in place    changed Dec 31 2018  . Hyperlipidemia   . Myocardial infarction [facility]) 1996or 1997  . Urinary retention     History reviewed. No pertinent family history.  SOCIAL HISTORY: Social History   Socioeconomic History  . Marital status: Married    Spouse name: Not on file  . Number of children: Not on file  . Years of education: Not on file  . Highest education level: Not on file  Occupational History  . Not on file  Tobacco Use  . Smoking status: Former Research scientist (life sciences)  . Smokeless tobacco: Former Systems developer  . Tobacco comment: quit more than 20 years ago  Vaping Use  . Vaping Use: Never used  Substance and Sexual Activity  . Alcohol use: No  . Drug use: No  . Sexual activity: Not on file  Other Topics Concern  . Not on file  Social History Narrative  . Not on file   Social Determinants of Health   Financial Resource Strain:   . Difficulty of Paying Living Expenses: Not on file  Food Insecurity:   . Worried About Charity fundraiser in  the Last Year: Not on file  . Ran Out of Food in the Last Year: Not on file  Transportation Needs:   . Lack of Transportation (Medical): Not on file  . Lack of Transportation (Non-Medical): Not on file  Physical Activity:   . Days of Exercise per Week: Not on file  . Minutes of Exercise per Session: Not on file  Stress:   . Feeling of Stress : Not on file  Social Connections:   . Frequency of Communication with Friends and Family: Not on file  . Frequency of Social Gatherings with Friends and Family: Not on file  . Attends Religious Services: Not on file  . Active Member of Clubs or Organizations: Not on file  . Attends Archivist Meetings: Not on file  . Marital Status: Not on file  Intimate Partner Violence:   . Fear of Current or Ex-Partner: Not on file  . Emotionally Abused: Not on file  . Physically Abused: Not on file  . Sexually Abused: Not on file    No Known Allergies  Current Outpatient Medications  Medication Sig Dispense Refill  . aspirin 325 MG EC tablet Take 325 mg by mouth daily.    Marland Kitchen atenolol (TENORMIN) 25 MG tablet Take 25 mg by mouth daily.      Marland Kitchen ezetimibe (ZETIA) 10 MG tablet Take 10 mg by mouth  daily.      . omeprazole (PRILOSEC) 20 MG capsule Take 1 capsule (20 mg total) by mouth daily. Take 30 min before breakfast 90 capsule 3   No current facility-administered medications for this visit.    REVIEW OF SYSTEMS:  [X]  denotes positive finding, [ ]  denotes negative finding Cardiac  Comments:  Chest pain or chest pressure:    Shortness of breath upon exertion:    Short of breath when lying flat:    Irregular heart rhythm:        Vascular    Pain in calf, thigh, or hip brought on by ambulation: x   Pain in feet at night that wakes you up from your sleep:     Blood clot in your veins:    Leg swelling:         Pulmonary    Oxygen at home:    Productive cough:     Wheezing:         Neurologic    Sudden weakness in arms or legs:       Sudden numbness in arms or legs:     Sudden onset of difficulty speaking or slurred speech:    Temporary loss of vision in one eye:     Problems with dizziness:         Gastrointestinal    Blood in stool:     Vomited blood:         Genitourinary    Burning when urinating:     Blood in urine:        Psychiatric    Major depression:         Hematologic    Bleeding problems:    Problems with blood clotting too easily:        Skin    Rashes or ulcers:        Constitutional    Fever or chills:      PHYSICAL EXAM: Vitals:   09/07/20 0938  BP: (!) 165/77  Pulse: (!) 52  Resp: 14  Temp: 97.7 F (36.5 C)  TempSrc: Temporal  SpO2: 97%  Weight: 156 lb (70.8 kg)  Height: 5\' 10"  (1.778 m)    GENERAL: The patient is a well-nourished male, in no acute distress. The vital signs are documented above. CARDIOVASCULAR: Carotid arteries without bruits bilaterally.  2+ radial, 2+ femoral, 2+ popliteal and 2+ posterior tibial pulses bilaterally. PULMONARY: There is good air exchange  ABDOMEN: Soft and non-tender  MUSCULOSKELETAL: There are no major deformities or cyanosis. NEUROLOGIC: No focal weakness or paresthesias are detected. SKIN: He does have psoriasis on both lower extremities and his pretibial and calf area PSYCHIATRIC: The patient has a normal affect.  DATA:  Noninvasive studies from July of this year were reviewed with the patient.  This reveals normal ankle arm index and normal triphasic waveforms.  MEDICAL ISSUES: Patient does have a prior ultrasound from approximately 4 to 5 years ago showing evidence of aortoiliac calcification.  His symptoms are classic for intermittent claudication related to vascular disease.  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  He reports that this is currently not limiting to him.  I feel that he is at no risk for limb threatening ischemia.  Discussed this with the patient.  He is  comfortable with observation only.  Explained that the next option would be CT angiogram to determine if aortoiliac occlusive disease was present.  Explained that if  this was the case that there is a very high likelihood he could be treated with angioplasty and stenting.  I certainly would reserve this for more limiting symptoms.  Fortunately he reports he is having some improvement.  I encouraged him to continue his walking program.  He will see [place] again on an as-needed basis   Rosetta Posner, MD [facility] Vascular and Vein Specialists of [facility] Tel [phone] Pager [phone]

## 2020-09-08 DIAGNOSIS — R69 Illness, unspecified: Secondary | ICD-10-CM | POA: Diagnosis not present

## 2020-10-01 DIAGNOSIS — Z23 Encounter for immunization: Secondary | ICD-10-CM | POA: Diagnosis not present

## 2020-12-25 DIAGNOSIS — Z20822 Contact with and (suspected) exposure to covid-19: Secondary | ICD-10-CM | POA: Diagnosis not present

## 2021-01-07 ENCOUNTER — Ambulatory Visit (INDEPENDENT_AMBULATORY_CARE_PROVIDER_SITE_OTHER): Payer: Medicare HMO | Admitting: Gastroenterology

## 2021-01-07 ENCOUNTER — Encounter (INDEPENDENT_AMBULATORY_CARE_PROVIDER_SITE_OTHER): Payer: Self-pay | Admitting: Gastroenterology

## 2021-01-07 ENCOUNTER — Other Ambulatory Visit: Payer: Self-pay

## 2021-01-07 ENCOUNTER — Encounter (INDEPENDENT_AMBULATORY_CARE_PROVIDER_SITE_OTHER): Payer: Self-pay

## 2021-01-07 VITALS — BP 167/72 | HR 66 | Temp 97.5°F | Ht 70.0 in | Wt 155.8 lb

## 2021-01-07 DIAGNOSIS — R159 Full incontinence of feces: Secondary | ICD-10-CM | POA: Diagnosis not present

## 2021-01-07 DIAGNOSIS — R1319 Other dysphagia: Secondary | ICD-10-CM | POA: Diagnosis not present

## 2021-01-07 NOTE — Progress Notes (Signed)
[name], M.D. Gastroenterology & Hepatology [facility] [address] [place], [place] [address]  Primary Care Physician: Asencion Noble, MD [address] [place] [address]  I will communicate my assessment and recommendations to the referring MD via EMR.  Problems: 1. Dysphagia 2. Fecal incontinence  History of Present Illness: [name] is a 87 y.o. male with past medical history of coronary artery disease, fecal incontinence, myocardial infarction and coronary artery disease status post CABG, who presents for follow up of dysphagia and fecal incontinence.  The patient was last seen on 05/08/2020. At that time, the patient was continued on omeprazole 20 mg every day as he was taking aspirin 325 mg daily.  He was also recommended to increase his fiber intake as he was having occasional episodes of fecal soiling.  Patient reports that for the last year he has presented recurrent episodes of fecal soiling. States that every 2-3 days he has a BM, most of the times with normal consistency. However, he noticed the next day he presents with fecal soiling 2-32 times per day with soft consistency. He does not notice when the stool is coming.  Denies any dyschezia, hematochezia, melena, abdominal pain or weight loss. Patient used fiber daily as asked before without any improvement.  Also reports that for the last  3 years he has had episodes of dysphagia to solids, but not with liquids. He has tried eating slowly to improve it, but still feels it. Thinks it is not as uncomfortable. No odynophagia or heartburn.  Last EGD 2018: - Web in the proximal esophagus. - Low-grade of narrowing Schatzki ring. Dilated with 54 Fr Maloney. - Esophageal mucosal changes secondary to established short-segment Barrett's disease. Biopsies only positive for reflux changes. - 6 cm hiatal hernia. - A few gastric polyps. - Normal duodenal bulb  and second portion of the duodenum.  Past Medical History: Past Medical History:  Diagnosis Date  . Barrett's esophagus 01/25/2017  . Coronary artery disease   . Dysphagia 01/25/2017   hx of  . Fecal incontinence    small amount leakage  . Foley catheter in place    changed Dec 31 2018  . Hyperlipidemia   . Myocardial infarction [facility]) 1996or 1997  . Urinary retention     Past Surgical History: Past Surgical History:  Procedure Laterality Date  . BACK SURGERY     lower  . cataract surgery     bilateral  . CORONARY ARTERY BYPASS GRAFT     15 yrs ago x 5   . ESOPHAGEAL DILATION N/A 02/17/2017   Procedure: ESOPHAGEAL DILATION;  Surgeon: Rogene Houston, MD;  [facility];  Service: Endoscopy;  Laterality: N/A;  . ESOPHAGOGASTRODUODENOSCOPY N/A 02/17/2017   Procedure: ESOPHAGOGASTRODUODENOSCOPY (EGD);  Surgeon: Rogene Houston, MD;  [facility];  Service: Endoscopy;  Laterality: N/A;  12:45  . ROTATOR CUFF REPAIR     Left  . TRANSURETHRAL RESECTION OF PROSTATE N/A 02/06/2019   Procedure: TRANSURETHRAL RESECTION OF THE PROSTATE (TURP);  Surgeon: [name], MD;  [facility];  Service: Urology;  Laterality: N/A;    Family History:No family history on file.  Social History: Social History   Tobacco Use  Smoking Status Former Smoker  Smokeless Tobacco Former Systems developer  Tobacco Comment   quit more than 20 years ago   Social History   Substance and Sexual Activity  Alcohol Use No   Social History  Substance and Sexual Activity  Drug Use No    Allergies: No Known Allergies  Medications: Current Outpatient Medications  Medication Sig Dispense Refill  . aspirin 325 MG EC tablet Take 325 mg by mouth daily.    Marland Kitchen atenolol (TENORMIN) 25 MG tablet Take 25 mg by mouth daily.      Marland Kitchen ezetimibe (ZETIA) 10 MG tablet Take 10 mg by mouth daily.      Marland Kitchen omeprazole (PRILOSEC) 20 MG capsule Take 1 capsule (20 mg total) by mouth daily. Take 30 min  before breakfast 90 capsule 3   No current facility-administered medications for this visit.    Review of Systems: GENERAL: negative for malaise, night sweats HEENT: No changes in hearing or vision, no nose bleeds or other nasal problems. NECK: Negative for lumps, goiter, pain and significant neck swelling RESPIRATORY: Negative for cough, wheezing CARDIOVASCULAR: Negative for chest pain, leg swelling, palpitations, orthopnea GI: SEE HPI MUSCULOSKELETAL: Negative for joint pain or swelling, back pain, and muscle pain. SKIN: Negative for lesions, rash PSYCH: Negative for sleep disturbance, mood disorder and recent psychosocial stressors. HEMATOLOGY Negative for prolonged bleeding, bruising easily, and swollen nodes. ENDOCRINE: Negative for cold or heat intolerance, polyuria, polydipsia and goiter. NEURO: negative for tremor, gait imbalance, syncope and seizures. The remainder of the review of systems is noncontributory.   Physical Exam: There were no vitals taken for this visit. GENERAL: The patient is AO x3, in no acute distress. HEENT: Head is normocephalic and atraumatic. EOMI are intact. Mouth is well hydrated and without lesions. NECK: Supple. No masses LUNGS: Clear to auscultation. No presence of rhonchi/wheezing/rales. Adequate chest expansion HEART: RRR, normal s1 and s2. ABDOMEN: Soft, nontender, no guarding, no peritoneal signs, and nondistended. BS +. No masses. EXTREMITIES: Without any cyanosis, clubbing, rash, lesions or edema. NEUROLOGIC: AOx3, no focal motor deficit. SKIN: no jaundice, no rashes  Imaging/Labs: as above  I personally reviewed and interpreted the available labs, imaging and endoscopic files.  Impression and Plan: [name] is a 87 y.o. male with past medical history of coronary artery disease, fecal incontinence, myocardial infarction and coronary artery disease status post CABG, who presents for follow up of dysphagia and fecal incontinence.   In terms of his dysphagia, he had evidence of Schatzki's ring 3 years ago which was successfully dilated, now he is presenting recurrent dysphagia which has not led to major weight loss but has been uncomfortable.  We will need to proceed with an EGD with esophageal dilation to improve the symptoms, likely related to recurrent ring/stricture.  On the other hand, the patient has presented fecal soiling events which sometimes happen without noticing.  His incontinence will need to be thoroughly evaluated with an anorectal manometry.  However, as he has presented some relative constipation that precedes the fecal soiling, it is possible that he is having some overflow diarrhea leading to these, we will investigate this further with an abdominal x-ray.  - Schedule EGD with esophageal dilation - Perform abdominal xray - Will refer for anorectal manometry  All questions were answered.      Harvel Quale, MD Gastroenterology and Hepatology [facility];

## 2021-01-07 NOTE — Patient Instructions (Signed)
Schedule EGD with esophageal dilation Perform abdominal xray Perform anorectal manometry

## 2021-01-08 ENCOUNTER — Other Ambulatory Visit (INDEPENDENT_AMBULATORY_CARE_PROVIDER_SITE_OTHER): Payer: Self-pay

## 2021-01-08 ENCOUNTER — Encounter (INDEPENDENT_AMBULATORY_CARE_PROVIDER_SITE_OTHER): Payer: Self-pay

## 2021-01-08 ENCOUNTER — Ambulatory Visit (HOSPITAL_COMMUNITY)
Admission: RE | Admit: 2021-01-08 | Discharge: 2021-01-08 | Disposition: A | Discharge status: Home / Self Care | Payer: Medicare HMO | Source: Ambulatory Visit | Attending: Gastroenterology | Admitting: Gastroenterology

## 2021-01-08 DIAGNOSIS — R159 Full incontinence of feces: Secondary | ICD-10-CM | POA: Insufficient documentation

## 2021-01-08 DIAGNOSIS — K5900 Constipation, unspecified: Secondary | ICD-10-CM | POA: Diagnosis not present

## 2021-01-18 DIAGNOSIS — E119 Type 2 diabetes mellitus without complications: Secondary | ICD-10-CM | POA: Diagnosis not present

## 2021-01-18 DIAGNOSIS — H52203 Unspecified astigmatism, bilateral: Secondary | ICD-10-CM | POA: Diagnosis not present

## 2021-01-18 DIAGNOSIS — H43813 Vitreous degeneration, bilateral: Secondary | ICD-10-CM | POA: Diagnosis not present

## 2021-01-18 DIAGNOSIS — H0100A Unspecified blepharitis right eye, upper and lower eyelids: Secondary | ICD-10-CM | POA: Diagnosis not present

## 2021-01-19 DIAGNOSIS — R159 Full incontinence of feces: Secondary | ICD-10-CM | POA: Diagnosis not present

## 2021-01-27 DIAGNOSIS — D2271 Melanocytic nevi of right lower limb, including hip: Secondary | ICD-10-CM | POA: Diagnosis not present

## 2021-01-27 DIAGNOSIS — L400 Psoriasis vulgaris: Secondary | ICD-10-CM | POA: Diagnosis not present

## 2021-01-27 DIAGNOSIS — D1801 Hemangioma of skin and subcutaneous tissue: Secondary | ICD-10-CM | POA: Diagnosis not present

## 2021-01-27 DIAGNOSIS — Z85828 Personal history of other malignant neoplasm of skin: Secondary | ICD-10-CM | POA: Diagnosis not present

## 2021-01-29 ENCOUNTER — Other Ambulatory Visit (HOSPITAL_COMMUNITY)
Admission: RE | Admit: 2021-01-29 | Discharge: 2021-01-29 | Disposition: A | Discharge status: Home / Self Care | Payer: Medicare HMO | Source: Ambulatory Visit | Attending: Gastroenterology | Admitting: Gastroenterology

## 2021-02-01 ENCOUNTER — Telehealth (INDEPENDENT_AMBULATORY_CARE_PROVIDER_SITE_OTHER): Payer: Self-pay | Admitting: Gastroenterology

## 2021-02-01 NOTE — Telephone Encounter (Signed)
Spoke to the patient regarding results of anorectal manometry performed on 01/19/2021. It showed normal IAS, adequate squeeze with EAS and normal duration of squeeze. Patient was able to expel balloon during expulsion test. RAIR and cough reflexes were normal. There was delayed sensation for first sensation and early urge and discomfort sensation to balloon volume distention - possible  Hypersensitivity. 2D/3D analysis showed asymmetry in left anterior position with diminished resting rectal tone in this area. I previously discussed these findings with Dr. Roosvelt Maser, who considered these alterations could explain the symptoms, but will require an anal ultrasound to confirm them before considering surgery. The patient understood and agreed.  Ann, please refer the patient to [facility] (either Dr. Aviva Signs or Othelia Pulling) for consideration of anal ultrasound.  Thanks

## 2021-02-02 ENCOUNTER — Encounter (INDEPENDENT_AMBULATORY_CARE_PROVIDER_SITE_OTHER): Payer: Self-pay

## 2021-02-02 ENCOUNTER — Ambulatory Visit (HOSPITAL_COMMUNITY): Admission: RE | Admit: 2021-02-02 | Payer: Medicare HMO | Source: Home / Self Care | Admitting: Gastroenterology

## 2021-02-02 ENCOUNTER — Encounter (HOSPITAL_COMMUNITY): Admission: RE | Payer: Self-pay | Source: Home / Self Care

## 2021-02-02 SURGERY — ESOPHAGOGASTRODUODENOSCOPY (EGD) WITH PROPOFOL
Anesthesia: Monitor Anesthesia Care

## 2021-02-02 NOTE — Telephone Encounter (Signed)
Referral faxed

## 2021-02-10 DIAGNOSIS — E785 Hyperlipidemia, unspecified: Secondary | ICD-10-CM | POA: Diagnosis not present

## 2021-02-10 DIAGNOSIS — N183 Chronic kidney disease, stage 3 unspecified: Secondary | ICD-10-CM | POA: Diagnosis not present

## 2021-02-10 DIAGNOSIS — Z79899 Other long term (current) drug therapy: Secondary | ICD-10-CM | POA: Diagnosis not present

## 2021-02-10 DIAGNOSIS — E875 Hyperkalemia: Secondary | ICD-10-CM | POA: Diagnosis not present

## 2021-02-10 DIAGNOSIS — E1129 Type 2 diabetes mellitus with other diabetic kidney complication: Secondary | ICD-10-CM | POA: Diagnosis not present

## 2021-02-15 ENCOUNTER — Other Ambulatory Visit (HOSPITAL_COMMUNITY)
Admission: RE | Admit: 2021-02-15 | Discharge: 2021-02-15 | Disposition: A | Discharge status: Home / Self Care | Payer: Medicare HMO | Source: Ambulatory Visit | Attending: Gastroenterology | Admitting: Gastroenterology

## 2021-02-15 ENCOUNTER — Encounter (HOSPITAL_COMMUNITY): Payer: Self-pay | Admitting: Gastroenterology

## 2021-02-15 ENCOUNTER — Other Ambulatory Visit: Payer: Self-pay

## 2021-02-15 DIAGNOSIS — Z20822 Contact with and (suspected) exposure to covid-19: Secondary | ICD-10-CM | POA: Insufficient documentation

## 2021-02-15 DIAGNOSIS — Z01812 Encounter for preprocedural laboratory examination: Secondary | ICD-10-CM | POA: Insufficient documentation

## 2021-02-15 LAB — SARS CORONAVIRUS 2 (TAT 6-24 HRS): SARS Coronavirus 2: NEGATIVE

## 2021-02-16 ENCOUNTER — Ambulatory Visit (HOSPITAL_COMMUNITY): Payer: Medicare HMO | Admitting: Anesthesiology

## 2021-02-16 ENCOUNTER — Ambulatory Visit (HOSPITAL_COMMUNITY)
Admission: RE | Admit: 2021-02-16 | Discharge: 2021-02-16 | Disposition: A | Discharge status: Home / Self Care | Payer: Medicare HMO | Attending: Gastroenterology | Admitting: Gastroenterology

## 2021-02-16 ENCOUNTER — Encounter (HOSPITAL_COMMUNITY)
Admission: RE | Disposition: A | Discharge status: Home / Self Care | Payer: Self-pay | Source: Home / Self Care | Attending: Gastroenterology

## 2021-02-16 ENCOUNTER — Other Ambulatory Visit: Payer: Self-pay

## 2021-02-16 ENCOUNTER — Encounter (HOSPITAL_COMMUNITY): Payer: Self-pay | Admitting: Gastroenterology

## 2021-02-16 ENCOUNTER — Other Ambulatory Visit (INDEPENDENT_AMBULATORY_CARE_PROVIDER_SITE_OTHER): Payer: Self-pay | Admitting: Gastroenterology

## 2021-02-16 DIAGNOSIS — Z7982 Long term (current) use of aspirin: Secondary | ICD-10-CM | POA: Insufficient documentation

## 2021-02-16 DIAGNOSIS — Z79899 Other long term (current) drug therapy: Secondary | ICD-10-CM | POA: Diagnosis not present

## 2021-02-16 DIAGNOSIS — Z87891 Personal history of nicotine dependence: Secondary | ICD-10-CM | POA: Diagnosis not present

## 2021-02-16 DIAGNOSIS — K317 Polyp of stomach and duodenum: Secondary | ICD-10-CM

## 2021-02-16 DIAGNOSIS — K449 Diaphragmatic hernia without obstruction or gangrene: Secondary | ICD-10-CM | POA: Insufficient documentation

## 2021-02-16 DIAGNOSIS — I2510 Atherosclerotic heart disease of native coronary artery without angina pectoris: Secondary | ICD-10-CM | POA: Insufficient documentation

## 2021-02-16 DIAGNOSIS — I252 Old myocardial infarction: Secondary | ICD-10-CM | POA: Insufficient documentation

## 2021-02-16 DIAGNOSIS — R1310 Dysphagia, unspecified: Secondary | ICD-10-CM | POA: Diagnosis not present

## 2021-02-16 DIAGNOSIS — Z951 Presence of aortocoronary bypass graft: Secondary | ICD-10-CM | POA: Insufficient documentation

## 2021-02-16 HISTORY — PX: BIOPSY: SHX5522

## 2021-02-16 HISTORY — PX: ESOPHAGEAL DILATION: SHX303

## 2021-02-16 HISTORY — PX: ESOPHAGOGASTRODUODENOSCOPY (EGD) WITH PROPOFOL: SHX5813

## 2021-02-16 HISTORY — PX: POLYPECTOMY: SHX5525

## 2021-02-16 SURGERY — ESOPHAGOGASTRODUODENOSCOPY (EGD) WITH PROPOFOL
Anesthesia: General

## 2021-02-16 MED ORDER — PROPOFOL 500 MG/50ML IV EMUL
INTRAVENOUS | Status: DC | PRN
  Administered 2021-02-16: 125 ug/kg/min via INTRAVENOUS

## 2021-02-16 MED ORDER — PROPOFOL 10 MG/ML IV BOLUS
INTRAVENOUS | Status: DC | PRN
  Administered 2021-02-16: 20 ug via INTRAVENOUS
  Administered 2021-02-16: 80 ug via INTRAVENOUS

## 2021-02-16 MED ORDER — LIDOCAINE HCL (PF) 2 % IJ SOLN
INTRAMUSCULAR | Status: DC | PRN
  Administered 2021-02-16: 50 mg

## 2021-02-16 MED ORDER — LIDOCAINE HCL (PF) 2 % IJ SOLN
INTRAMUSCULAR | Status: AC
  Filled 2021-02-16: qty 5

## 2021-02-16 MED ORDER — PROPOFOL 10 MG/ML IV BOLUS
INTRAVENOUS | Status: AC
  Filled 2021-02-16: qty 40

## 2021-02-16 MED ORDER — LACTATED RINGERS IV SOLN: INTRAVENOUS | Status: DC

## 2021-02-16 MED ORDER — OMEPRAZOLE 20 MG PO CPDR: 20.0000 mg | DELAYED_RELEASE_CAPSULE | Freq: Two times a day (BID) | ORAL | 0 refills | Status: DC

## 2021-02-16 NOTE — Anesthesia Postprocedure Evaluation (Signed)
Anesthesia Post Note  Patient: [name]  Procedure(s) Performed: ESOPHAGOGASTRODUODENOSCOPY (EGD) WITH PROPOFOL (N/A ) ESOPHAGEAL DILATION (N/A ) BIOPSY POLYPECTOMY  Patient location during evaluation: Endoscopy Anesthesia Type: General Level of consciousness: awake and alert Pain management: pain level controlled Vital Signs Assessment: post-procedure vital signs reviewed and stable Respiratory status: spontaneous breathing, nonlabored ventilation, respiratory function stable and patient connected to nasal cannula oxygen Cardiovascular status: blood pressure returned to baseline and stable Postop Assessment: no apparent nausea or vomiting Anesthetic complications: no   No complications documented.   Last Vitals:  Vitals:   02/16/21 0807 02/16/21 0953  BP: (!) 149/76 133/84  Resp: 19 15  Temp: 36.5 C 36.4 C  SpO2:  97%    Last Pain:  Vitals:   02/16/21 0953  TempSrc: Oral  PainSc: 0-No pain                 Talitha Givens

## 2021-02-16 NOTE — Op Note (Signed)
[facility] Patient Name: [name] Procedure Date: 02/16/2021 9:19 AM MRN: [id number] Date of Birth: [birth year] Attending MD: [name] ,  CSN: [id number] Age: 87 Admit Type: Outpatient Procedure:                Upper GI endoscopy Indications:              Dysphagia Providers:                [name], Janeece Riggers, RN, Kristine L.                            Risa Grill, Technician Referring MD:              Medicines:                Monitored Anesthesia Care Complications:            No immediate complications. Estimated Blood Loss:     Estimated blood loss: none. Procedure:                Pre-Anesthesia Assessment:                           - Prior to the procedure, a History and Physical                            was performed, and patient medications, allergies                            and sensitivities were reviewed. The patient's                            tolerance of previous anesthesia was reviewed.                           - The risks and benefits of the procedure and the                            sedation options and risks were discussed with the                            patient. All questions were answered and informed                            consent was obtained.                           - ASA Grade Assessment: III - A patient with severe                            systemic disease.                           After obtaining informed consent, the endoscope was                            passed under direct vision. Throughout the  procedure, the patient's blood pressure, pulse, and                            oxygen saturations were monitored continuously. The                            GIF-H190 ([id number]) scope was introduced through the                            mouth, and advanced to the second part of duodenum.                            The upper GI endoscopy was accomplished without                             difficulty. The patient tolerated the procedure                            well. Scope In: 9:33:53 AM Scope Out: 9:47:07 AM Total Procedure Duration: 0 hours 13 minutes 14 seconds  Findings:      No endoscopic abnormality was evident in the esophagus to explain the       patient's complaint of dysphagia. The scope was advanced without any       resistance. It was decided, however, to proceed with dilation of the       entire esophagus. A guidewire was placed and the scope was withdrawn.       Dilation was performed with a Savary dilator with mild resistance at 18       mm. Upon reinspection, I was able to visualize mucosal disruption in the       proximal esophagus, just distal to the upper esophageal sphincter       (possible muscle bar).      A 2 cm hiatal hernia was present.      A few 3 to 5 mm sessile polyps with no bleeding were found in the       gastric fundus and in the gastric body. Most of the polyps appeared to       be fundic gland polyps, but one polyp had an adenomatous appearance.       This polyp was removed with a hot snare. Resection and retrieval were       complete. Biopsies were taken from the other polyps with a cold forceps       for histology.      The examined duodenum was normal. Impression:               - No endoscopic esophageal abnormality to explain                            patient's dysphagia. Esophagus dilated. Evidence of                            mucosal disruption - possible muscle bar in                            proximal esophagus.                           -  2 cm hiatal hernia.                           - A few gastric polyps. Resected and retrieved.                            Biopsied.                           - Normal examined duodenum. Moderate Sedation:      Per Anesthesia Care Recommendation:           - Discharge patient to home (ambulatory).                           - Resume previous diet.                           - Use  Prilosec (omeprazole) 20 mg PO BID for 1                            month, then continue once a day indefinitely.                           - Await pathology results.                           - Resume aspirin at prior dose tomorrow. Procedure Code(s):        --- Professional ---                           [phone], Esophagogastroduodenoscopy, flexible,                            transoral; with removal of tumor(s), polyp(s), or                            other lesion(s) by snare technique                           43248, Esophagogastroduodenoscopy, flexible,                            transoral; with insertion of guide wire followed by                            passage of dilator(s) through esophagus over guide                            wire                           54098, 33, Esophagogastroduodenoscopy, flexible,                            transoral; with biopsy, single or multiple Diagnosis Code(s):        --- Professional ---  R13.10, Dysphagia, unspecified                           K44.9, Diaphragmatic hernia without obstruction or                            gangrene                           K31.7, Polyp of stomach and duodenum CPT copyright 2019 American Medical Association. All rights reserved. The codes documented in this report are preliminary and upon coder review may  be revised to meet current compliance requirements. [name], MD [name],  02/16/2021 10:02:03 AM This report has been signed electronically. Number of Addenda: 0

## 2021-02-16 NOTE — H&P (Signed)
[name] is an 87 y.o. male.   Chief Complaint: Dysphagia HPI: [name] is a 87 y.o. male with past medical history of coronary artery disease, fecal incontinence, myocardial infarction and coronary artery disease status post CABG,  who comes to the hospital for evaluation of dysphagia.  Patient reported for the last 3 years he has had worsening episodes of dysphagia to solids, occasionally to liquids but this is not often.  He has modified his diet to improve his symptoms but still is presenting symptoms of food "getting stuck and taking longer to go down his throat".  Has never had an episode of food impaction.  Denies any odynophagia or heartburn.  Last EGD 2018: - Web in the proximal esophagus. - Low-grade of narrowing Schatzki ring. Dilated with 54 Fr Maloney. - Esophageal mucosal changes secondary to established short-segment Barrett's disease. Biopsies only positive for reflux changes. - 6 cm hiatal hernia. - A few gastric polyps. - Normal duodenal bulb and second portion of the duodenum.  Past Medical History:  Diagnosis Date  . Barrett's esophagus 01/25/2017  . Coronary artery disease   . Dysphagia 01/25/2017   hx of  . Fecal incontinence    small amount leakage  . Foley catheter in place    changed Dec 31 2018  . Hyperlipidemia   . Myocardial infarction [facility]) 1996or 1997  . Urinary retention     Past Surgical History:  Procedure Laterality Date  . BACK SURGERY     lower  . cataract surgery     bilateral  . CORONARY ARTERY BYPASS GRAFT     15 yrs ago x 5   . ESOPHAGEAL DILATION N/A 02/17/2017   Procedure: ESOPHAGEAL DILATION;  Surgeon: Rogene Houston, MD;  Location: [facility];  Service: Endoscopy;  Laterality: N/A;  . ESOPHAGOGASTRODUODENOSCOPY N/A 02/17/2017   Procedure: ESOPHAGOGASTRODUODENOSCOPY (EGD);  Surgeon: Rogene Houston, MD;  Location: [facility];  Service: Endoscopy;  Laterality: N/A;  12:45  . ROTATOR CUFF REPAIR     Left  .  TRANSURETHRAL RESECTION OF PROSTATE N/A 02/06/2019   Procedure: TRANSURETHRAL RESECTION OF THE PROSTATE (TURP);  Surgeon: [name], MD;  Location: [facility] ORS;  Service: Urology;  Laterality: N/A;    History reviewed. No pertinent family history. Social History:  reports that he has quit smoking. He has quit using smokeless tobacco. He reports that he does not drink alcohol and does not use drugs.  Allergies: No Known Allergies  Medications Prior to Admission  Medication Sig Dispense Refill  . acetaminophen (TYLENOL) 500 MG tablet Take 500-1,000 mg by mouth every 6 (six) hours as needed (pain).    Marland Kitchen aspirin 325 MG EC tablet Take 325 mg by mouth daily.    Marland Kitchen atenolol (TENORMIN) 25 MG tablet Take 25 mg by mouth daily.    . clobetasol cream (TEMOVATE) 7.65 % Apply 1 application topically daily.    Marland Kitchen ezetimibe (ZETIA) 10 MG tablet Take 10 mg by mouth daily.    Marland Kitchen omeprazole (PRILOSEC) 20 MG capsule Take 1 capsule (20 mg total) by mouth daily. Take 30 min before breakfast 90 capsule 3    Results for orders placed or performed during the hospital encounter of 02/15/21 (from the past 48 hour(s))  SARS CORONAVIRUS 2 (TAT 6-24 HRS) Nasopharyngeal Nasopharyngeal Swab     Status: None   Collection Time: 02/15/21  8:30 AM   Specimen: Nasopharyngeal Swab  Result Value Ref Range   SARS Coronavirus 2  NEGATIVE NEGATIVE    Comment: (NOTE) SARS-CoV-2 target nucleic acids are NOT DETECTED.  The SARS-CoV-2 RNA is generally detectable in upper and lower respiratory specimens during the acute phase of infection. Negative results do not preclude SARS-CoV-2 infection, do not rule out co-infections with other pathogens, and should not be used as the sole basis for treatment or other patient management decisions. Negative results must be combined with clinical observations, patient history, and epidemiological information. The expected result is Negative.  Fact Sheet for  Patients: SugarRoll.be  Fact Sheet for Healthcare Providers: https://www.woods-mathews.com/  This test is not yet approved or cleared by the [place] FDA and  has been authorized for detection and/or diagnosis of SARS-CoV-2 by FDA under an Emergency Use Authorization (EUA). This EUA will remain  in effect (meaning this test can be used) for the duration of the COVID-19 declaration under Se ction 564(b)(1) of the Act, 21 U.S.C. section 360bbb-3(b)(1), unless the authorization is terminated or revoked sooner.  Performed at [facility], [place] [address], [place], [place] [address]    No results found.  Review of Systems  Constitutional: Negative.   HENT: Positive for trouble swallowing.   Eyes: Negative.   Respiratory: Negative.   Cardiovascular: Negative.   Gastrointestinal: Negative.   Endocrine: Negative.   Genitourinary: Negative.   Musculoskeletal: Negative.   Skin: Negative.   Allergic/Immunologic: Negative.   Neurological: Negative.   Hematological: Negative.   Psychiatric/Behavioral: Negative.     Blood pressure (!) 149/76, temperature 97.7 F (36.5 C), temperature source Oral, resp. rate 19, height 5\' 10"  (1.778 m), weight 72.6 kg. Physical Exam  GENERAL: The patient is AO x3, in no acute distress. HEENT: Head is normocephalic and atraumatic. EOMI are intact. Mouth is well hydrated and without lesions. NECK: Supple. No masses LUNGS: Clear to auscultation. No presence of rhonchi/wheezing/rales. Adequate chest expansion HEART: RRR, normal s1 and s2. ABDOMEN: Soft, nontender, no guarding, no peritoneal signs, and nondistended. BS +. No masses. EXTREMITIES: Without any cyanosis, clubbing, rash, lesions or edema. NEUROLOGIC: AOx3, no focal motor deficit. SKIN: no jaundice, no rashes  Assessment/Plan [name] is a 87 y.o. male with past medical history of coronary artery disease, fecal incontinence,  myocardial infarction and coronary artery disease status post CABG,  who comes to the hospital for evaluation of dysphagia.  We will proceed with EGD and possible esophageal dilation.  Harvel Quale, MD 02/16/2021, 8:19 AM

## 2021-02-16 NOTE — Telephone Encounter (Signed)
Let's work on [employer] PA authorization. Other option would be for him to buy Prilosec over the counter, which is the same.

## 2021-02-16 NOTE — Anesthesia Preprocedure Evaluation (Signed)
Anesthesia Evaluation  Patient identified by MRN, date of birth, ID band Patient awake    Reviewed: Allergy & Precautions, H&P , NPO status , Patient's Chart, lab work & pertinent test results, reviewed documented beta blocker date and time   Airway Mallampati: II  TM Distance: >3 FB Neck ROM: full    Dental no notable dental hx.    Pulmonary neg pulmonary ROS, former smoker,    Pulmonary exam normal breath sounds clear to auscultation       Cardiovascular Exercise Tolerance: Good + CAD, + Past MI and + CABG   Rhythm:regular Rate:Normal     Neuro/Psych negative neurological ROS  negative psych ROS   GI/Hepatic negative GI ROS, Neg liver ROS,   Endo/Other  negative endocrine ROS  Renal/GU negative Renal ROS  negative genitourinary   Musculoskeletal   Abdominal   Peds  Hematology negative hematology ROS (+)   Anesthesia Other Findings   Reproductive/Obstetrics negative OB ROS                             Anesthesia Physical Anesthesia Plan  ASA: III  Anesthesia Plan: General   Post-op Pain Management:    Induction:   PONV Risk Score and Plan: Propofol infusion  Airway Management Planned:   Additional Equipment:   Intra-op Plan:   Post-operative Plan:   Informed Consent: I have reviewed the patients History and Physical, chart, labs and discussed the procedure including the risks, benefits and alternatives for the proposed anesthesia with the patient or authorized representative who has indicated his/her understanding and acceptance.     Dental Advisory Given  Plan Discussed with: CRNA  Anesthesia Plan Comments:         Anesthesia Quick Evaluation

## 2021-02-16 NOTE — Discharge Instructions (Signed)
You are being discharged to home.  Resume your previous diet.  Take Prilosec (omeprazole) 20 mg by mouth twice a day for one month.  We are waiting for your pathology results.  Resume taking aspirin at your prior dose tomorrow.    Hiatal Hernia  A hiatal hernia occurs when part of the stomach slides above the muscle that separates the abdomen from the chest (diaphragm). A person can be born with a hiatal hernia (congenital), or it may develop over time. In almost all cases of hiatal hernia, only the top part of the stomach pushes through the diaphragm. Many people have a hiatal hernia with no symptoms. The larger the hernia, the more likely it is that you will have symptoms. In some cases, a hiatal hernia allows stomach acid to flow back into the tube that carries food from your mouth to your stomach (esophagus). This may cause heartburn symptoms. Severe heartburn symptoms may mean that you have developed a condition called gastroesophageal reflux disease (GERD). What are the causes? This condition is caused by a weakness in the opening (hiatus) where the esophagus passes through the diaphragm to attach to the upper part of the stomach. A person may be born with a weakness in the hiatus, or a weakness can develop over time. What increases the risk? This condition is more likely to develop in:  Older people. Age is a major risk factor for a hiatal hernia, especially if you are over the age of 44.  Pregnant women.  People who are overweight.  People who have frequent constipation. What are the signs or symptoms? Symptoms of this condition usually develop in the form of GERD symptoms. Symptoms include:  Heartburn.  Belching.  Indigestion.  Trouble swallowing.  Coughing or wheezing.  Sore throat.  Hoarseness.  Chest pain.  Nausea and vomiting. How is this diagnosed? This condition may be diagnosed during testing for GERD. Tests that may be done include:  X-rays of your  stomach or chest.  An upper gastrointestinal (GI) series. This is an X-ray exam of your GI tract that is taken after you swallow a chalky liquid that shows up clearly on the X-ray.  Endoscopy. This is a procedure to look into your stomach using a thin, flexible tube that has a tiny camera and light on the end of it. How is this treated? This condition may be treated by:  Dietary and lifestyle changes to help reduce GERD symptoms.  Medicines. These may include: ? Over-the-counter antacids. ? Medicines that make your stomach empty more quickly. ? Medicines that block the production of stomach acid (H2 blockers). ? Stronger medicines to reduce stomach acid (proton pump inhibitors).  Surgery to repair the hernia, if other treatments are not helping. If you have no symptoms, you may not need treatment. Follow these instructions at home: Lifestyle and activity  Do not use any products that contain nicotine or tobacco, such as cigarettes and e-cigarettes. If you need help quitting, ask your health care provider.  Try to achieve and maintain a healthy body weight.  Avoid putting pressure on your abdomen. Anything that puts pressure on your abdomen increases the amount of acid that may be pushed up into your esophagus. ? Avoid bending over, especially after eating. ? Raise the head of your bed by putting blocks under the legs. This keeps your head and esophagus higher than your stomach. ? Do not wear tight clothing around your chest or stomach. ? Try not to strain when having a  bowel movement, when urinating, or when lifting heavy objects. Eating and drinking  Avoid foods that can worsen GERD symptoms. These may include: ? Fatty foods, like fried foods. ? Citrus fruits, like oranges or lemon. ? Other foods and drinks that contain acid, like orange juice or tomatoes. ? Spicy food. ? Chocolate.  Eat frequent small meals instead of three large meals a day. This helps prevent your stomach  from getting too full. ? Eat slowly. ? Do not lie down right after eating. ? Do not eat 1-2 hours before bed.  Do not drink beverages with caffeine. These include cola, coffee, cocoa, and tea.  Do not drink alcohol. General instructions  Take over-the-counter and prescription medicines only as told by your health care provider.  Keep all follow-up visits as told by your health care provider. This is important. Contact a health care provider if:  Your symptoms are not controlled with medicines or lifestyle changes.  You are having trouble swallowing.  You have coughing or wheezing that will not go away. Get help right away if:  Your pain is getting worse.  Your pain spreads to your arms, neck, jaw, teeth, or back.  You have shortness of breath.  You sweat for no reason.  You feel sick to your stomach (nauseous) or you vomit.  You vomit blood.  You have bright red blood in your stools.  You have black, tarry stools. This information is not intended to replace advice given to you by your health care provider. Make sure you discuss any questions you have with your health care provider. Document Revised: 11/24/2017 Document Reviewed: 07/17/2017 Elsevier Patient Education  2021 [place].  Upper Endoscopy, Adult, Care After This sheet gives you information about how to care for yourself after your procedure. Your health care provider may also give you more specific instructions. If you have problems or questions, contact your health care provider. What can I expect after the procedure? After the procedure, it is common to have:  A sore throat.  Mild stomach pain or discomfort.  Bloating.  Nausea. Follow these instructions at home:  Follow instructions from your health care provider about what to eat or drink after your procedure.  Return to your normal activities as told by your health care provider. Ask your health care provider what activities are safe for  you.  Take over-the-counter and prescription medicines only as told by your health care provider.  If you were given a sedative during the procedure, it can affect you for several hours. Do not drive or operate machinery until your health care provider says that it is safe.  Keep all follow-up visits as told by your health care provider. This is important.   Contact a health care provider if you have:  A sore throat that lasts longer than one day.  Trouble swallowing. Get help right away if:  You vomit blood or your vomit looks like coffee grounds.  You have: ? A fever. ? Bloody, black, or tarry stools. ? A severe sore throat or you cannot swallow. ? Difficulty breathing. ? Severe pain in your chest or abdomen. Summary  After the procedure, it is common to have a sore throat, mild stomach discomfort, bloating, and nausea.  If you were given a sedative during the procedure, it can affect you for several hours. Do not drive or operate machinery until your health care provider says that it is safe.  Follow instructions from your health care provider about what to  eat or drink after your procedure.  Return to your normal activities as told by your health care provider. This information is not intended to replace advice given to you by your health care provider. Make sure you discuss any questions you have with your health care provider. Document Revised: 12/10/2019 Document Reviewed: 05/14/2018 Elsevier Patient Education  2021 [employer].

## 2021-02-16 NOTE — Transfer of Care (Signed)
Immediate Anesthesia Transfer of Care Note  Patient: [name]  Procedure(s) Performed: ESOPHAGOGASTRODUODENOSCOPY (EGD) WITH PROPOFOL (N/A ) ESOPHAGEAL DILATION (N/A ) BIOPSY POLYPECTOMY  Patient Location: PACU  Anesthesia Type:General  Level of Consciousness: awake, alert  and oriented  Airway & Oxygen Therapy: Patient Spontanous Breathing  Post-op Assessment: Report given to RN, Post -op Vital signs reviewed and stable and Patient moving all extremities X 4  Post vital signs: Reviewed and stable  Last Vitals:  Vitals Value Taken Time  BP    Temp    Pulse    Resp    SpO2      Last Pain:  Vitals:   02/16/21 0935  TempSrc:   PainSc: 0-No pain      Patients Stated Pain Goal: 7 (82/88/33 [id number])  Complications: No complications documented.

## 2021-02-17 DIAGNOSIS — I700 Atherosclerosis of aorta: Secondary | ICD-10-CM | POA: Diagnosis not present

## 2021-02-17 DIAGNOSIS — E1129 Type 2 diabetes mellitus with other diabetic kidney complication: Secondary | ICD-10-CM | POA: Diagnosis not present

## 2021-02-17 DIAGNOSIS — N183 Chronic kidney disease, stage 3 unspecified: Secondary | ICD-10-CM | POA: Diagnosis not present

## 2021-02-17 LAB — SURGICAL PATHOLOGY

## 2021-02-18 ENCOUNTER — Encounter (HOSPITAL_COMMUNITY): Payer: Self-pay | Admitting: Gastroenterology

## 2021-03-11 DIAGNOSIS — R198 Other specified symptoms and signs involving the digestive system and abdomen: Secondary | ICD-10-CM | POA: Diagnosis not present

## 2021-03-11 DIAGNOSIS — K6289 Other specified diseases of anus and rectum: Secondary | ICD-10-CM | POA: Diagnosis not present

## 2021-03-11 DIAGNOSIS — R159 Full incontinence of feces: Secondary | ICD-10-CM | POA: Diagnosis not present

## 2021-03-19 ENCOUNTER — Telehealth (INDEPENDENT_AMBULATORY_CARE_PROVIDER_SITE_OTHER): Payer: Self-pay | Admitting: Gastroenterology

## 2021-03-19 NOTE — Telephone Encounter (Signed)
I called the patient to inform about the results of rectal endoscopic ultrasound which showed normal sphincter without presence of any tears but there was slight thinning of the external anal sphincter.  He will benefit from biofeedback therapy, we will refer him to [facility] for further management of this.  I also advised him to take Benefiber on a daily basis to increase the bulk of his stools to make the episodes of incontinence less frequent.  The patient understood and agreed.  Ann, can you please refer the patient to [facility] for biofeedback therapy.  Diagnosis: Fecal incontinence.  Thanks,  Roy Peppers, MD Gastroenterology and Hepatology [facility]

## 2021-03-22 NOTE — Telephone Encounter (Signed)
Referral faxed to [facility], they will contact patient with apt

## 2021-03-31 ENCOUNTER — Telehealth (INDEPENDENT_AMBULATORY_CARE_PROVIDER_SITE_OTHER): Payer: Self-pay | Admitting: *Deleted

## 2021-03-31 NOTE — Telephone Encounter (Signed)
Biofeedback therapy sch'd 05/07/21 - called patient of advise of appt - he acted like he didn't know anything about referral or you even calling him - he asked me to cancel that apt and he wants you to call him to discuss

## 2021-03-31 NOTE — Telephone Encounter (Signed)
Apt canceled

## 2021-03-31 NOTE — Telephone Encounter (Signed)
I called the patient and inform him that the biofeedback therapy was meant to improve his incontinence.  Unfortunately, the patient states that he lives too far from [facility] and will not be able to go to the sessions.  He would like to cancel the referral.

## 2021-05-19 ENCOUNTER — Ambulatory Visit (INDEPENDENT_AMBULATORY_CARE_PROVIDER_SITE_OTHER): Payer: Medicare HMO | Admitting: Gastroenterology

## 2021-05-20 ENCOUNTER — Telehealth (INDEPENDENT_AMBULATORY_CARE_PROVIDER_SITE_OTHER): Payer: Medicare HMO | Admitting: Gastroenterology

## 2021-05-20 ENCOUNTER — Other Ambulatory Visit: Payer: Self-pay

## 2021-05-20 ENCOUNTER — Encounter (INDEPENDENT_AMBULATORY_CARE_PROVIDER_SITE_OTHER): Payer: Self-pay | Admitting: Gastroenterology

## 2021-05-20 VITALS — Ht 70.0 in | Wt 160.0 lb

## 2021-05-20 DIAGNOSIS — R151 Fecal smearing: Secondary | ICD-10-CM | POA: Diagnosis not present

## 2021-05-20 DIAGNOSIS — Q394 Esophageal web: Secondary | ICD-10-CM

## 2021-05-20 NOTE — Patient Instructions (Signed)
Continue Prilosec 20 mg twice a day.

## 2021-05-20 NOTE — Progress Notes (Signed)
[name], M.D. Gastroenterology & Hepatology [facility] [address] [place], [place] [address] Primary Care Physician: Asencion Noble, MD [address] [place] [address]   This is a telephone virtual visit.  It required patient-provider interaction for the medical decision making as documented below. The patient has consented and agreed to proceed with a Telehealth encounter given the current Coronavirus pandemic.  VIRTUAL VISIT NOTE Patient location: home Provider location: home  I will communicate my assessment and recommendations to the referring MD via EMR.  Problems: 1. 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 History of Barrett's esophagus in past, most recent EGD only reflux changes  History of Present Illness: [name] is a 87 y.o. male with past medical history of coronary artery disease, fecal incontinence, myocardial infarction and coronary artery disease status post CABG who presents for evaluation of dysphagia and fecal smearing.  Last time seen in clinic was on 01/07/2021. patient underwent an EGD On 02/16/2021 which showed normal esophagus, and empiric dilation was performed with a Savary dilator up to 18 mm with disruption of the upper esophageal sphincter, a 2 cm hiatal hernia was present but no changes of Barrett's esophagus were found, gastric polyps were present with biopsies consistent with hyperplastic polyp, normal duodenum.  He also underwent anorectal manometry and anorectal endoscopic ultrasound which showed thickening of one of the layers of the muscle for which he was recommended to have biofeedback treatment (per recommendation of motility experts in [facility]).  However, the patient reported that she was not able to commit to [facility] and was not interested in proceeding with this.  The patient states that his  dysphagia has improved after his most recent EGD, had very occasional problems when swallowing solids.  He feels much better and has been compliant with intake of his omeprazole.  Denies any odynophagia, heartburn, odynophagia, nausea, vomiting, fever, chills, hematochezia, melena, hematemesis, abdominal distention, abdominal pain, diarrhea, jaundice, pruritus or weight loss.  He is still having some episodes of fecal soiling, possibly 3 times the last month. It comes as a small smear that soft. Has previously tried fiber and Imodium but it has not helped.  Overall he has been able to tolerate this adequately and has not found it to be very cumbersome for him.   Past Medical History: Past Medical History:  Diagnosis Date  . Barrett's esophagus 01/25/2017  . Coronary artery disease   . Dysphagia 01/25/2017   hx of  . Fecal incontinence    small amount leakage  . Foley catheter in place    changed Dec 31 2018  . Hyperlipidemia   . Myocardial infarction [facility]) 1996or 1997  . Urinary retention     Past Surgical History: Past Surgical History:  Procedure Laterality Date  . BACK SURGERY     lower  . BIOPSY  02/16/2021   Procedure: BIOPSY;  Surgeon: Montez Morita, Quillian Quince, MD;  [facility];  Service: Gastroenterology;;  . cataract surgery     bilateral  . CORONARY ARTERY BYPASS GRAFT     15 yrs ago x 5   . ESOPHAGEAL DILATION N/A 02/17/2017   Procedure: ESOPHAGEAL DILATION;  Surgeon: Rogene Houston, MD;  [facility];  Service: Endoscopy;  Laterality: N/A;  . ESOPHAGEAL DILATION N/A 02/16/2021   Procedure: ESOPHAGEAL DILATION;  Surgeon: Harvel Quale, MD;  [facility];  Service: Gastroenterology;  Laterality: N/A;  .  ESOPHAGOGASTRODUODENOSCOPY N/A 02/17/2017   Procedure: ESOPHAGOGASTRODUODENOSCOPY (EGD);  Surgeon: Rogene Houston, MD;  [facility];  Service: Endoscopy;  Laterality: N/A;  12:45  . ESOPHAGOGASTRODUODENOSCOPY (EGD)  WITH PROPOFOL N/A 02/16/2021   Procedure: ESOPHAGOGASTRODUODENOSCOPY (EGD) WITH PROPOFOL;  Surgeon: Harvel Quale, MD;  [facility];  Service: Gastroenterology;  Laterality: N/A;  9:15  . POLYPECTOMY  02/16/2021   Procedure: POLYPECTOMY;  Surgeon: Harvel Quale, MD;  [facility];  Service: Gastroenterology;;  . Cherre Robins CUFF REPAIR     Left  . TRANSURETHRAL RESECTION OF PROSTATE N/A 02/06/2019   Procedure: TRANSURETHRAL RESECTION OF THE PROSTATE (TURP);  Surgeon: [name], MD;  [facility];  Service: Urology;  Laterality: N/A;    Family History:History reviewed. No pertinent family history.  Social History: Social History   Tobacco Use  Smoking Status Former Smoker  Smokeless Tobacco Former Systems developer  Tobacco Comment   quit more than 20 years ago   Social History   Substance and Sexual Activity  Alcohol Use No   Social History   Substance and Sexual Activity  Drug Use No    Allergies: No Known Allergies  Medications: Current Outpatient Medications  Medication Sig Dispense Refill  . acetaminophen (TYLENOL) 500 MG tablet Take 500-1,000 mg by mouth every 6 (six) hours as needed (pain).    Marland Kitchen aspirin 325 MG EC tablet Take 325 mg by mouth daily.    Marland Kitchen atenolol (TENORMIN) 25 MG tablet Take 25 mg by mouth daily.    . clobetasol cream (TEMOVATE) 0.27 % Apply 1 application topically daily.    Marland Kitchen omeprazole (PRILOSEC) 20 MG capsule TAKE 1 CAPSULE (20 MG TOTAL) BY MOUTH 2 (TWO) TIMES DAILY BEFORE A MEAL. TAKE 30 MIN BEFORE BREAKFAST 60 capsule 0  . ezetimibe (ZETIA) 10 MG tablet Take 10 mg by mouth daily. (Patient not taking: Reported on 05/20/2021)     No current facility-administered medications for this visit.    Review of Systems: GENERAL: negative for malaise, night sweats HEENT: No changes in hearing or vision, no nose bleeds or other nasal problems. NECK: Negative for lumps, goiter, pain and significant neck  swelling RESPIRATORY: Negative for cough, wheezing CARDIOVASCULAR: Negative for chest pain, leg swelling, palpitations, orthopnea GI: SEE HPI MUSCULOSKELETAL: Negative for joint pain or swelling, back pain, and muscle pain. SKIN: Negative for lesions, rash PSYCH: Negative for sleep disturbance, mood disorder and recent psychosocial stressors. HEMATOLOGY Negative for prolonged bleeding, bruising easily, and swollen nodes. ENDOCRINE: Negative for cold or heat intolerance, polyuria, polydipsia and goiter. NEURO: negative for tremor, gait imbalance, syncope and seizures. The remainder of the review of systems is noncontributory.   Physical Exam: No exam was performed as this was a telephone encounter  Imaging/Labs: as above  I personally reviewed and interpreted the available labs, imaging and endoscopic files.  Impression and Plan: AUDIE STAYER is a 87 y.o. male with past medical history of coronary artery disease, fecal incontinence, myocardial infarction and coronary artery disease status post CABG who presents for evaluation of dysphagia and fecal smearing.  He dysphagia has much more improved after his most recent empiric dilation.  It is likely the recurrent dysphagia is related to pharyngoesophageal muscle bar/wet that this repeat dilations and disruptions as needed.  For now we will continue him on his PPI at the current dosage.  In terms of his fecal smearing, the best therapy for him will be biofeedback therapy but he is not interested in  pursuing this.  He has not improved with oral medications.  We will continue monitoring for now.  - Continue Prilosec 20 mg twice a day  All questions were answered.      Total visit time: I spent a total of  20 minutes  [name], MD Gastroenterology and Hepatology [facility]

## 2021-06-07 ENCOUNTER — Other Ambulatory Visit (INDEPENDENT_AMBULATORY_CARE_PROVIDER_SITE_OTHER): Payer: Self-pay | Admitting: Gastroenterology

## 2021-06-07 ENCOUNTER — Other Ambulatory Visit (INDEPENDENT_AMBULATORY_CARE_PROVIDER_SITE_OTHER): Payer: Self-pay

## 2021-06-07 DIAGNOSIS — E785 Hyperlipidemia, unspecified: Secondary | ICD-10-CM | POA: Diagnosis not present

## 2021-06-07 DIAGNOSIS — L400 Psoriasis vulgaris: Secondary | ICD-10-CM | POA: Diagnosis not present

## 2021-06-07 DIAGNOSIS — K219 Gastro-esophageal reflux disease without esophagitis: Secondary | ICD-10-CM

## 2021-06-07 DIAGNOSIS — I252 Old myocardial infarction: Secondary | ICD-10-CM | POA: Diagnosis not present

## 2021-06-07 DIAGNOSIS — Z7982 Long term (current) use of aspirin: Secondary | ICD-10-CM | POA: Diagnosis not present

## 2021-06-07 DIAGNOSIS — R1319 Other dysphagia: Secondary | ICD-10-CM

## 2021-06-07 DIAGNOSIS — Z8249 Family history of ischemic heart disease and other diseases of the circulatory system: Secondary | ICD-10-CM | POA: Diagnosis not present

## 2021-06-07 DIAGNOSIS — I2510 Atherosclerotic heart disease of native coronary artery without angina pectoris: Secondary | ICD-10-CM | POA: Diagnosis not present

## 2021-06-07 DIAGNOSIS — Z87891 Personal history of nicotine dependence: Secondary | ICD-10-CM | POA: Diagnosis not present

## 2021-06-07 MED ORDER — OMEPRAZOLE 20 MG PO CPDR: 20.0000 mg | DELAYED_RELEASE_CAPSULE | Freq: Two times a day (BID) | ORAL | 3 refills | Status: DC

## 2021-06-07 MED ORDER — ESOMEPRAZOLE MAGNESIUM 20 MG PO CPDR: 20.0000 mg | DELAYED_RELEASE_CAPSULE | Freq: Two times a day (BID) | ORAL | 3 refills | Status: DC

## 2021-08-13 DIAGNOSIS — I2510 Atherosclerotic heart disease of native coronary artery without angina pectoris: Secondary | ICD-10-CM | POA: Diagnosis not present

## 2021-08-13 DIAGNOSIS — Z79899 Other long term (current) drug therapy: Secondary | ICD-10-CM | POA: Diagnosis not present

## 2021-08-13 DIAGNOSIS — I739 Peripheral vascular disease, unspecified: Secondary | ICD-10-CM | POA: Diagnosis not present

## 2021-08-13 DIAGNOSIS — R1310 Dysphagia, unspecified: Secondary | ICD-10-CM | POA: Diagnosis not present

## 2021-08-13 DIAGNOSIS — N183 Chronic kidney disease, stage 3 unspecified: Secondary | ICD-10-CM | POA: Diagnosis not present

## 2021-08-13 DIAGNOSIS — E1129 Type 2 diabetes mellitus with other diabetic kidney complication: Secondary | ICD-10-CM | POA: Diagnosis not present

## 2021-08-13 DIAGNOSIS — E785 Hyperlipidemia, unspecified: Secondary | ICD-10-CM | POA: Diagnosis not present

## 2021-08-20 DIAGNOSIS — I2510 Atherosclerotic heart disease of native coronary artery without angina pectoris: Secondary | ICD-10-CM | POA: Diagnosis not present

## 2021-08-20 DIAGNOSIS — I700 Atherosclerosis of aorta: Secondary | ICD-10-CM | POA: Diagnosis not present

## 2021-08-20 DIAGNOSIS — R7309 Other abnormal glucose: Secondary | ICD-10-CM | POA: Diagnosis not present

## 2021-08-20 DIAGNOSIS — E875 Hyperkalemia: Secondary | ICD-10-CM | POA: Diagnosis not present

## 2021-08-20 DIAGNOSIS — N183 Chronic kidney disease, stage 3 unspecified: Secondary | ICD-10-CM | POA: Diagnosis not present

## 2021-08-20 DIAGNOSIS — E1129 Type 2 diabetes mellitus with other diabetic kidney complication: Secondary | ICD-10-CM | POA: Diagnosis not present

## 2021-08-20 DIAGNOSIS — E785 Hyperlipidemia, unspecified: Secondary | ICD-10-CM | POA: Diagnosis not present

## 2021-08-23 DIAGNOSIS — E875 Hyperkalemia: Secondary | ICD-10-CM | POA: Diagnosis not present

## 2021-10-04 DIAGNOSIS — Z23 Encounter for immunization: Secondary | ICD-10-CM | POA: Diagnosis not present

## 2022-01-18 DIAGNOSIS — Z0100 Encounter for examination of eyes and vision without abnormal findings: Secondary | ICD-10-CM | POA: Diagnosis not present

## 2022-01-18 DIAGNOSIS — H5200 Hypermetropia, unspecified eye: Secondary | ICD-10-CM | POA: Diagnosis not present

## 2022-02-25 ENCOUNTER — Other Ambulatory Visit: Payer: Self-pay | Admitting: Internal Medicine

## 2022-02-25 ENCOUNTER — Other Ambulatory Visit (HOSPITAL_COMMUNITY): Payer: Self-pay | Admitting: Internal Medicine

## 2022-02-25 DIAGNOSIS — I77819 Aortic ectasia, unspecified site: Secondary | ICD-10-CM

## 2022-02-25 DIAGNOSIS — N1330 Unspecified hydronephrosis: Secondary | ICD-10-CM

## 2022-03-04 ENCOUNTER — Other Ambulatory Visit: Payer: Self-pay

## 2022-03-04 ENCOUNTER — Ambulatory Visit (HOSPITAL_COMMUNITY)
Admission: RE | Admit: 2022-03-04 | Discharge: 2022-03-04 | Disposition: A | Discharge status: Home / Self Care | Payer: Medicare HMO | Source: Ambulatory Visit | Attending: Internal Medicine | Admitting: Internal Medicine

## 2022-03-04 DIAGNOSIS — I77819 Aortic ectasia, unspecified site: Secondary | ICD-10-CM | POA: Diagnosis present

## 2022-03-04 DIAGNOSIS — N1330 Unspecified hydronephrosis: Secondary | ICD-10-CM | POA: Insufficient documentation

## 2022-05-19 ENCOUNTER — Ambulatory Visit (INDEPENDENT_AMBULATORY_CARE_PROVIDER_SITE_OTHER): Payer: Medicare HMO | Admitting: Gastroenterology

## 2022-09-17 IMAGING — DX DG ABDOMEN 1V
2 series · 2 of 2 positions shown · non-contrast
Comparison: None.

CLINICAL DATA: Constipation

EXAM:
ABDOMEN - 1 VIEW

[abdomen kub (1 of 2)]
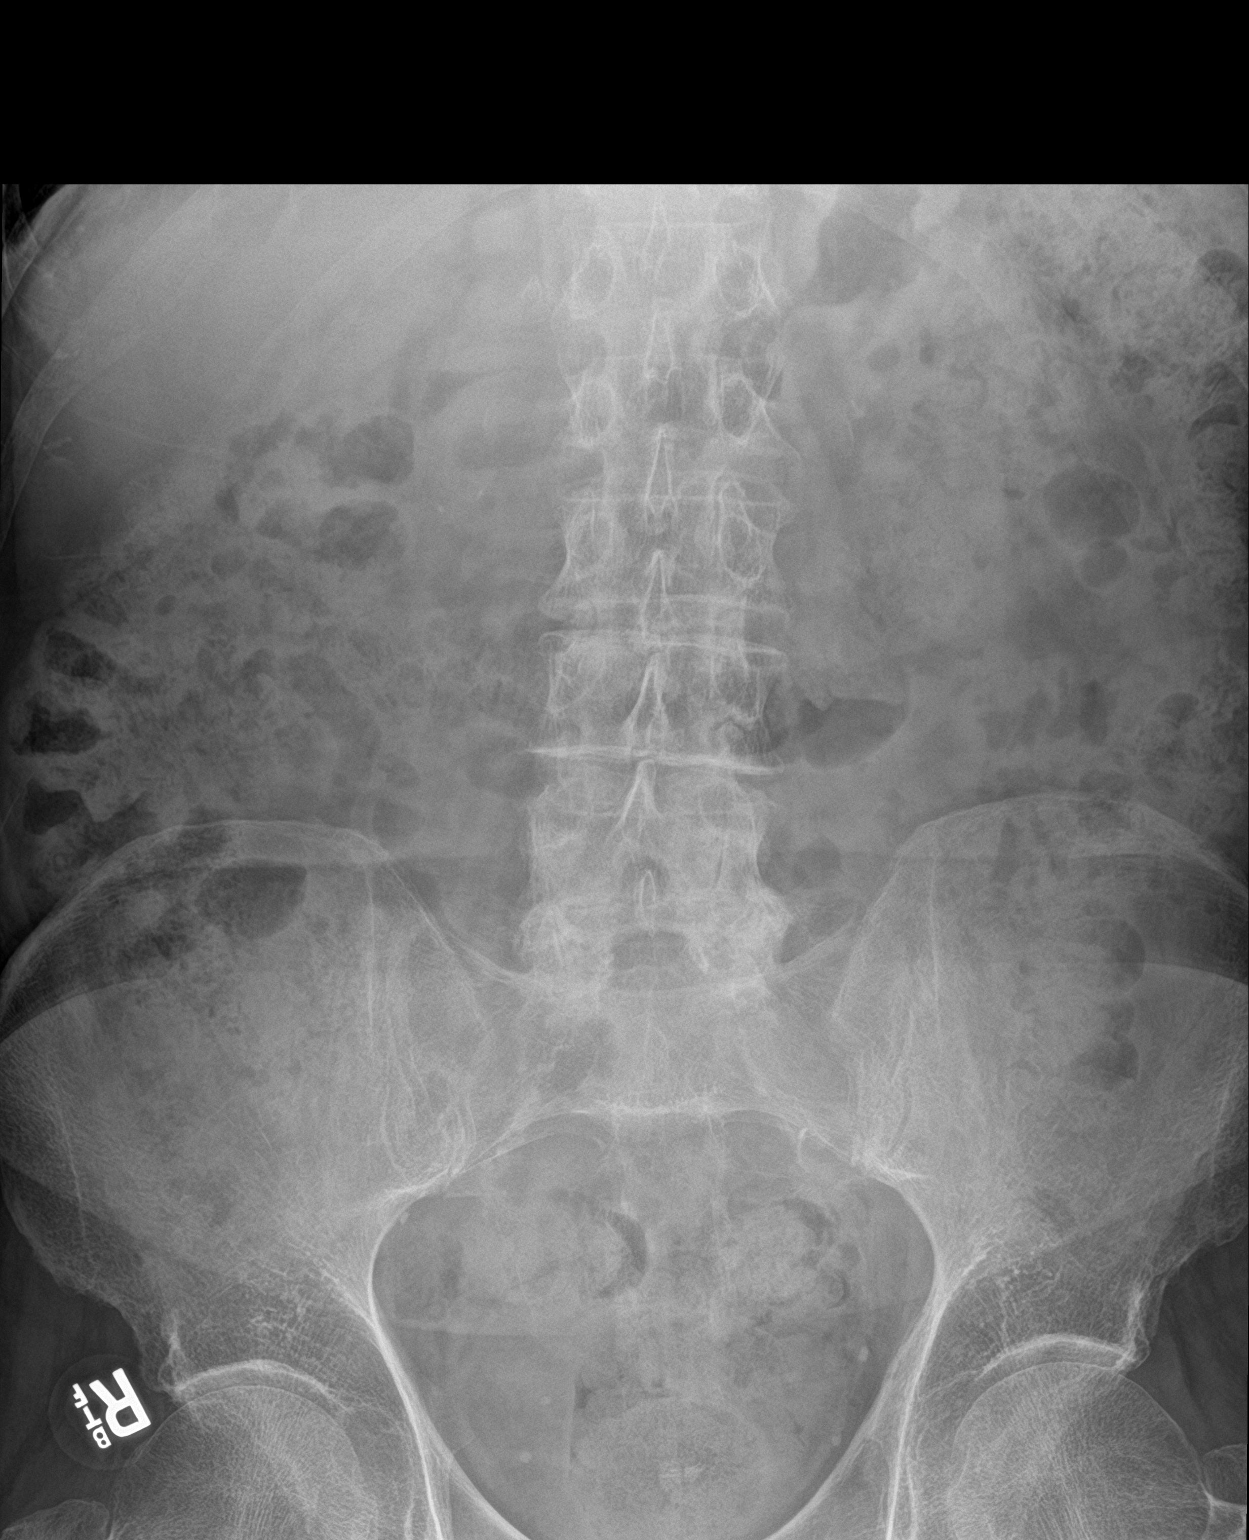

[abdomen kub (2 of 2)]
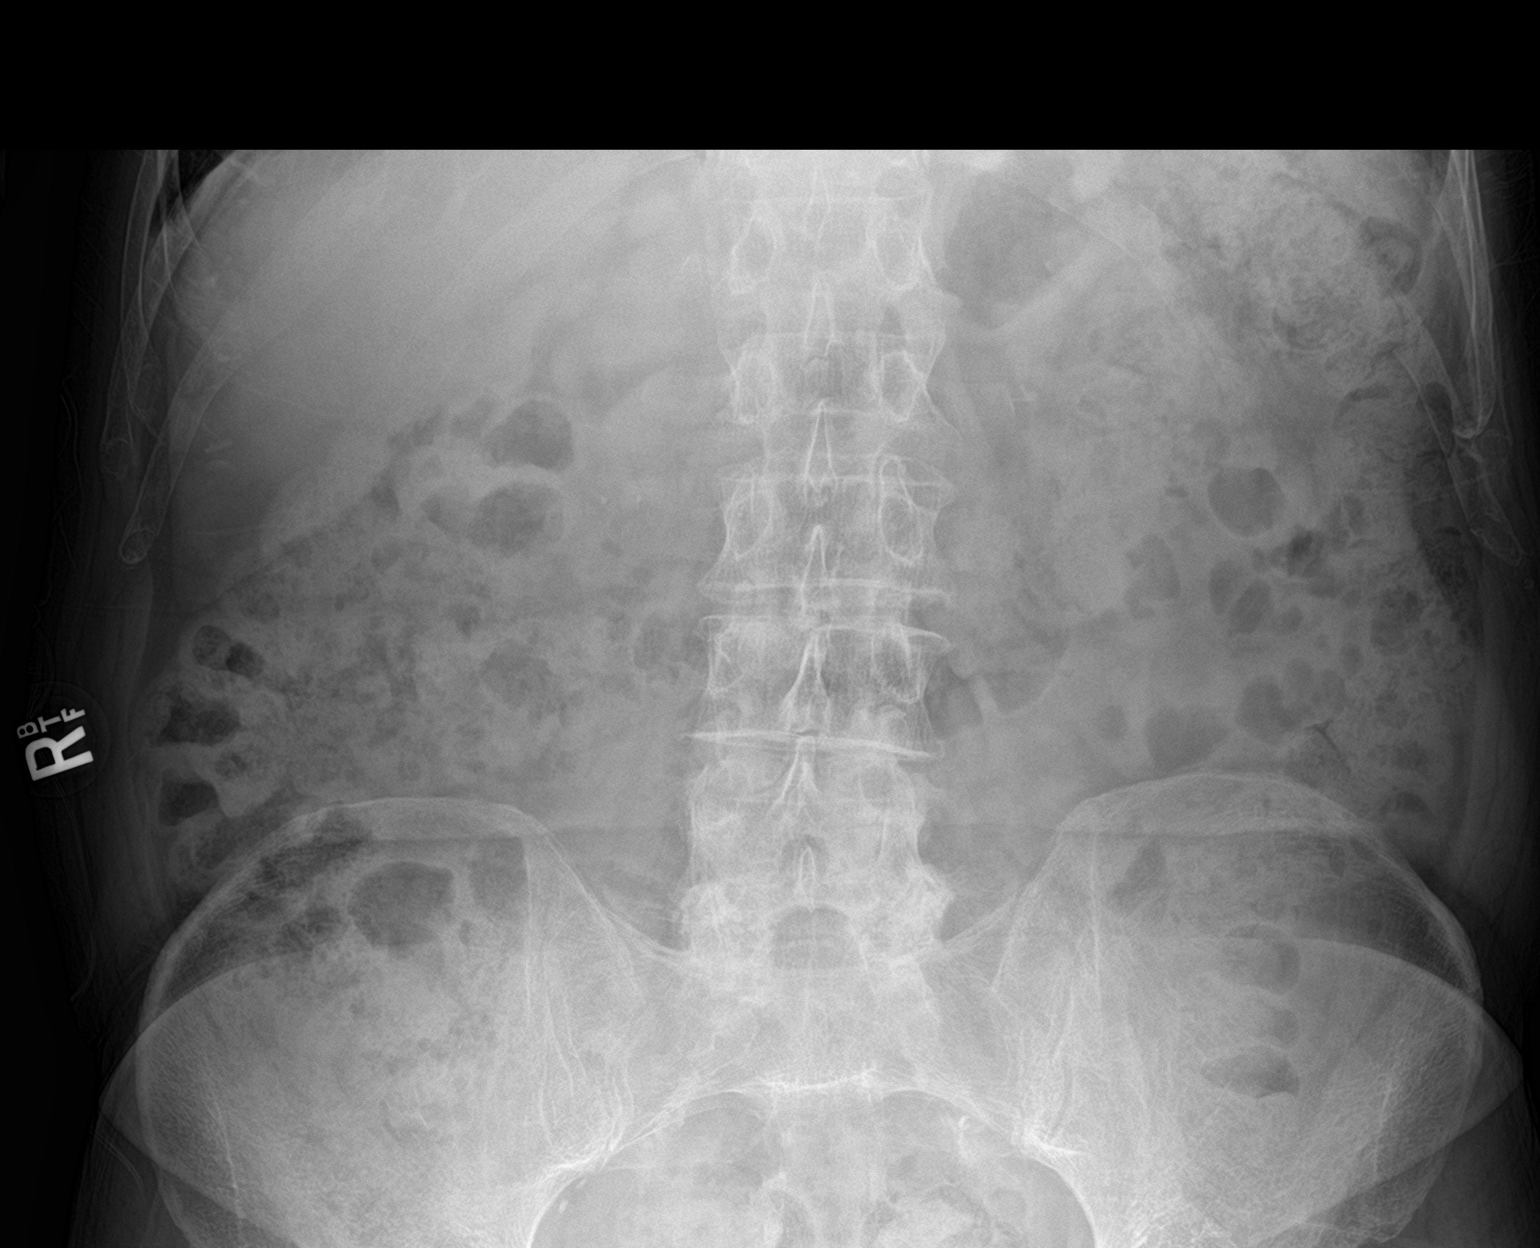

[2 of 2 positions shown; findings below may reference images not displayed]

FINDINGS: Normal bowel gas pattern. There is moderate stool throughout the
colon. No acute osseous abnormality.
IMPRESSION: Moderate stool throughout the colon.

## 2022-10-21 ENCOUNTER — Other Ambulatory Visit (HOSPITAL_COMMUNITY): Payer: Self-pay | Admitting: Internal Medicine

## 2022-10-21 ENCOUNTER — Other Ambulatory Visit: Payer: Self-pay | Admitting: Internal Medicine

## 2022-10-21 DIAGNOSIS — R1011 Right upper quadrant pain: Secondary | ICD-10-CM

## 2022-10-27 ENCOUNTER — Ambulatory Visit (HOSPITAL_COMMUNITY)
Admission: RE | Admit: 2022-10-27 | Discharge: 2022-10-27 | Disposition: A | Discharge status: Home / Self Care | Payer: Medicare HMO | Source: Ambulatory Visit | Attending: Internal Medicine | Admitting: Internal Medicine

## 2022-10-27 ENCOUNTER — Other Ambulatory Visit (HOSPITAL_COMMUNITY): Payer: Self-pay | Admitting: Internal Medicine

## 2022-10-27 ENCOUNTER — Other Ambulatory Visit: Payer: Self-pay

## 2022-10-27 DIAGNOSIS — R1011 Right upper quadrant pain: Secondary | ICD-10-CM

## 2022-10-27 MED ORDER — GADOBUTROL 1 MMOL/ML IV SOLN
7.0000 mL | Freq: Once | INTRAVENOUS | Status: AC | PRN
  Administered 2022-10-27: 7 mL via INTRAVENOUS

## 2022-10-31 ENCOUNTER — Telehealth: Payer: Self-pay | Admitting: Gastroenterology

## 2022-10-31 NOTE — Telephone Encounter (Signed)
Inbound call from Dr Maryann Conners with his contact information in regards to patient. Stated he has Dr Rush Landmark cell number prior but has lost it. He can be reached on his cell at [phone] or at the office at [phone]. Please advise.

## 2022-11-01 ENCOUNTER — Other Ambulatory Visit: Payer: Self-pay

## 2022-11-01 DIAGNOSIS — K8050 Calculus of bile duct without cholangitis or cholecystitis without obstruction: Secondary | ICD-10-CM

## 2022-11-01 NOTE — Telephone Encounter (Signed)
ERCP has been scheduled for 12/08/22 at 9 am at [facility] with GM  Left message on machine to call back

## 2022-11-01 NOTE — Telephone Encounter (Signed)
[name], This is a patient previously of Dr. Laural Golden and now followed by Dr. Jenetta Downer. I called and spoke with Dr. Willey Blade about this patient. He has evidence of choledocholithiasis on his MRI/MRCP that was performed recently and I have reviewed this. Liver tests are still pending at this time but the patient seems to be stable without overt jaundice at this point, and Dr. Willey Blade with we will update our office with what those results show. I told him that due to staffing, availability for outpatient ERCP in our anesthesia unit will be a while but if the patient is stable without overt jaundice, then hopefully we can get him done as an outpatient. If he were to develop fevers or chills or overt significant jaundice he would need to come into the hospital (recommend [facility] or [facility] since there is no ERCP coverage at [facility]). Dr. Willey Blade agrees with this plan of action and will update [place]. Please schedule him with my next available ERCP date. Thanks. GM  I am placing the biliary group on here as well to see if any of them have an opening on their hospital block weeks since I am probably scheduling into the middle of December at this point if I am not the correct.  Please let us know if you all have any availability and are willing to get this done sooner for this patient.  Thanks.

## 2022-11-02 NOTE — Telephone Encounter (Signed)
ERCP scheduled, pt instructed and medications reviewed.  Patient instructions mailed to home and sent to My Chart.  Patient to call with any questions or concerns.  

## 2022-11-04 ENCOUNTER — Encounter: Payer: Self-pay | Admitting: Gastroenterology

## 2022-11-08 ENCOUNTER — Encounter (HOSPITAL_COMMUNITY): Payer: Self-pay | Admitting: Gastroenterology

## 2022-11-08 NOTE — Progress Notes (Signed)
Attempted to obtain medical history via telephone, unable to reach at this time. HIPAA compliant voicemail message left requesting return call to pre surgical testing department. 

## 2022-11-10 ENCOUNTER — Ambulatory Visit (HOSPITAL_COMMUNITY)
Admission: RE | Admit: 2022-11-10 | Discharge: 2022-11-10 | Disposition: A | Discharge status: Home / Self Care | Payer: Medicare HMO | Attending: Gastroenterology | Admitting: Gastroenterology

## 2022-11-10 ENCOUNTER — Encounter (HOSPITAL_COMMUNITY)
Admission: RE | Disposition: A | Discharge status: Home / Self Care | Payer: Self-pay | Source: Home / Self Care | Attending: Gastroenterology

## 2022-11-10 ENCOUNTER — Encounter (HOSPITAL_COMMUNITY): Payer: Self-pay | Admitting: Gastroenterology

## 2022-11-10 ENCOUNTER — Ambulatory Visit (HOSPITAL_COMMUNITY): Payer: Medicare HMO | Admitting: Certified Registered Nurse Anesthetist

## 2022-11-10 ENCOUNTER — Ambulatory Visit (HOSPITAL_COMMUNITY): Payer: Medicare HMO

## 2022-11-10 ENCOUNTER — Other Ambulatory Visit: Payer: Self-pay

## 2022-11-10 ENCOUNTER — Ambulatory Visit (HOSPITAL_BASED_OUTPATIENT_CLINIC_OR_DEPARTMENT_OTHER): Payer: Medicare HMO | Admitting: Certified Registered Nurse Anesthetist

## 2022-11-10 ENCOUNTER — Telehealth: Payer: Self-pay

## 2022-11-10 DIAGNOSIS — R932 Abnormal findings on diagnostic imaging of liver and biliary tract: Secondary | ICD-10-CM | POA: Insufficient documentation

## 2022-11-10 DIAGNOSIS — R748 Abnormal levels of other serum enzymes: Secondary | ICD-10-CM | POA: Insufficient documentation

## 2022-11-10 DIAGNOSIS — I2510 Atherosclerotic heart disease of native coronary artery without angina pectoris: Secondary | ICD-10-CM | POA: Insufficient documentation

## 2022-11-10 DIAGNOSIS — K8050 Calculus of bile duct without cholangitis or cholecystitis without obstruction: Secondary | ICD-10-CM | POA: Diagnosis present

## 2022-11-10 DIAGNOSIS — K2289 Other specified disease of esophagus: Secondary | ICD-10-CM | POA: Diagnosis not present

## 2022-11-10 DIAGNOSIS — K838 Other specified diseases of biliary tract: Secondary | ICD-10-CM

## 2022-11-10 DIAGNOSIS — Z951 Presence of aortocoronary bypass graft: Secondary | ICD-10-CM | POA: Insufficient documentation

## 2022-11-10 DIAGNOSIS — K449 Diaphragmatic hernia without obstruction or gangrene: Secondary | ICD-10-CM

## 2022-11-10 DIAGNOSIS — Z87891 Personal history of nicotine dependence: Secondary | ICD-10-CM | POA: Diagnosis not present

## 2022-11-10 DIAGNOSIS — K3189 Other diseases of stomach and duodenum: Secondary | ICD-10-CM | POA: Insufficient documentation

## 2022-11-10 DIAGNOSIS — Z9889 Other specified postprocedural states: Secondary | ICD-10-CM

## 2022-11-10 DIAGNOSIS — I252 Old myocardial infarction: Secondary | ICD-10-CM | POA: Insufficient documentation

## 2022-11-10 HISTORY — PX: REMOVAL OF STONES: SHX5545

## 2022-11-10 HISTORY — PX: PANCREATIC STENT PLACEMENT: SHX5539

## 2022-11-10 HISTORY — PX: SPHINCTEROTOMY: SHX5279

## 2022-11-10 HISTORY — PX: ENDOSCOPIC RETROGRADE CHOLANGIOPANCREATOGRAPHY (ERCP) WITH PROPOFOL: SHX5810

## 2022-11-10 HISTORY — PX: BIOPSY: SHX5522

## 2022-11-10 LAB — COMPREHENSIVE METABOLIC PANEL
ALT: 19 U/L (ref 0–44)
AST: 21 U/L (ref 15–41)
Albumin: 4.1 g/dL (ref 3.5–5.0)
Alkaline Phosphatase: 106 U/L (ref 38–126)
Anion gap: 5 (ref 5–15)
BUN: 23 mg/dL (ref 8–23)
CO2: 30 mmol/L (ref 22–32)
Calcium: 9.3 mg/dL (ref 8.9–10.3)
Chloride: 107 mmol/L (ref 98–111)
Creatinine, Ser: 1.24 mg/dL (ref 0.61–1.24)
GFR, Estimated: 57 mL/min — ABNORMAL LOW (ref >60)
Glucose, Bld: 118 mg/dL — ABNORMAL HIGH (ref 70–99)
Potassium: 4.1 mmol/L (ref 3.5–5.1)
Sodium: 142 mmol/L (ref 135–145)
Total Bilirubin: 0.9 mg/dL (ref 0.3–1.2)
Total Protein: 7.1 g/dL (ref 6.5–8.1)

## 2022-11-10 SURGERY — ENDOSCOPIC RETROGRADE CHOLANGIOPANCREATOGRAPHY (ERCP) WITH PROPOFOL
Anesthesia: General

## 2022-11-10 MED ORDER — PHENYLEPHRINE HCL-NACL 20-0.9 MG/250ML-% IV SOLN
INTRAVENOUS | Status: DC | PRN
  Administered 2022-11-10: 50 ug/min via INTRAVENOUS

## 2022-11-10 MED ORDER — LIDOCAINE 2% (20 MG/ML) 5 ML SYRINGE
INTRAMUSCULAR | Status: DC | PRN
  Administered 2022-11-10: 60 mg via INTRAVENOUS

## 2022-11-10 MED ORDER — PROPOFOL 10 MG/ML IV BOLUS
INTRAVENOUS | Status: AC
  Filled 2022-11-10: qty 20

## 2022-11-10 MED ORDER — EPINEPHRINE 1 MG/10ML IJ SOSY
PREFILLED_SYRINGE | INTRAMUSCULAR | Status: AC
  Filled 2022-11-10: qty 10

## 2022-11-10 MED ORDER — LACTATED RINGERS IV SOLN
INTRAVENOUS | Status: AC | PRN
  Administered 2022-11-10: 1000 mL via INTRAVENOUS

## 2022-11-10 MED ORDER — PROPOFOL 10 MG/ML IV BOLUS
INTRAVENOUS | Status: DC | PRN
  Administered 2022-11-10: 130 mg via INTRAVENOUS

## 2022-11-10 MED ORDER — OMEPRAZOLE 40 MG PO CPDR: 40.0000 mg | DELAYED_RELEASE_CAPSULE | Freq: Every day | ORAL | 12 refills | Status: AC

## 2022-11-10 MED ORDER — ONDANSETRON HCL 4 MG/2ML IJ SOLN
INTRAMUSCULAR | Status: DC | PRN
  Administered 2022-11-10: 4 mg via INTRAVENOUS

## 2022-11-10 MED ORDER — FENTANYL CITRATE (PF) 100 MCG/2ML IJ SOLN
INTRAMUSCULAR | Status: DC | PRN
  Administered 2022-11-10: 100 ug via INTRAVENOUS

## 2022-11-10 MED ORDER — SUGAMMADEX SODIUM 200 MG/2ML IV SOLN
INTRAVENOUS | Status: DC | PRN
  Administered 2022-11-10: 200 mg via INTRAVENOUS

## 2022-11-10 MED ORDER — GLUCAGON HCL RDNA (DIAGNOSTIC) 1 MG IJ SOLR
INTRAMUSCULAR | Status: AC
  Filled 2022-11-10: qty 1

## 2022-11-10 MED ORDER — INDOMETHACIN 50 MG RE SUPP
RECTAL | Status: AC
  Filled 2022-11-10: qty 2

## 2022-11-10 MED ORDER — DEXAMETHASONE SODIUM PHOSPHATE 10 MG/ML IJ SOLN
INTRAMUSCULAR | Status: DC | PRN
  Administered 2022-11-10: 5 mg via INTRAVENOUS

## 2022-11-10 MED ORDER — ROCURONIUM BROMIDE 10 MG/ML (PF) SYRINGE
PREFILLED_SYRINGE | INTRAVENOUS | Status: DC | PRN
  Administered 2022-11-10: 60 mg via INTRAVENOUS

## 2022-11-10 MED ORDER — FENTANYL CITRATE (PF) 100 MCG/2ML IJ SOLN
INTRAMUSCULAR | Status: AC
  Filled 2022-11-10: qty 2

## 2022-11-10 MED ORDER — SODIUM CHLORIDE 0.9 % IV SOLN
INTRAVENOUS | Status: DC | PRN
  Administered 2022-11-10: 25 mL

## 2022-11-10 MED ORDER — CIPROFLOXACIN IN D5W 400 MG/200ML IV SOLN
INTRAVENOUS | Status: DC | PRN
  Administered 2022-11-10: 400 mg via INTRAVENOUS

## 2022-11-10 MED ORDER — CIPROFLOXACIN IN D5W 400 MG/200ML IV SOLN
INTRAVENOUS | Status: AC
  Filled 2022-11-10: qty 200

## 2022-11-10 MED ORDER — SODIUM CHLORIDE 0.9 % IV SOLN: INTRAVENOUS | Status: DC

## 2022-11-10 MED ORDER — EPHEDRINE SULFATE-NACL 50-0.9 MG/10ML-% IV SOSY
PREFILLED_SYRINGE | INTRAVENOUS | Status: DC | PRN
  Administered 2022-11-10 (×2): 10 mg via INTRAVENOUS
  Administered 2022-11-10: 5 mg via INTRAVENOUS

## 2022-11-10 MED ORDER — GLUCAGON HCL RDNA (DIAGNOSTIC) 1 MG IJ SOLR
INTRAMUSCULAR | Status: DC | PRN
  Administered 2022-11-10: .25 mg via INTRAVENOUS

## 2022-11-10 MED ORDER — INDOMETHACIN 50 MG RE SUPP
RECTAL | Status: DC | PRN
  Administered 2022-11-10: 100 mg via RECTAL

## 2022-11-10 NOTE — H&P (Signed)
GASTROENTEROLOGY PROCEDURE H&P NOTE   Primary Care Physician: Asencion Noble, MD  HPI: [name] is a 88 y.o. male who presents for ERCP for attempt at choledocholithiasis removal.  Past Medical History:  Diagnosis Date   Barrett's esophagus 01/25/2017   Coronary artery disease    Dysphagia 01/25/2017   hx of   Fecal incontinence    small amount leakage   Foley catheter in place    changed Dec 31 2018   Hyperlipidemia    Myocardial infarction [facility]) 1996or 1997   Urinary retention    Past Surgical History:  Procedure Laterality Date   BACK SURGERY     lower   BIOPSY  02/16/2021   Procedure: BIOPSY;  Surgeon: Montez Morita, Quillian Quince, MD;  [facility];  Service: Gastroenterology;;   cataract surgery     bilateral   CORONARY ARTERY BYPASS GRAFT     15 yrs ago x 5    ESOPHAGEAL DILATION N/A 02/17/2017   Procedure: ESOPHAGEAL DILATION;  Surgeon: Rogene Houston, MD;  [facility];  Service: Endoscopy;  Laterality: N/A;   ESOPHAGEAL DILATION N/A 02/16/2021   Procedure: ESOPHAGEAL DILATION;  Surgeon: Harvel Quale, MD;  [facility];  Service: Gastroenterology;  Laterality: N/A;   ESOPHAGOGASTRODUODENOSCOPY N/A 02/17/2017   Procedure: ESOPHAGOGASTRODUODENOSCOPY (EGD);  Surgeon: Rogene Houston, MD;  [facility];  Service: Endoscopy;  Laterality: N/A;  12:45   ESOPHAGOGASTRODUODENOSCOPY (EGD) WITH PROPOFOL N/A 02/16/2021   Procedure: ESOPHAGOGASTRODUODENOSCOPY (EGD) WITH PROPOFOL;  Surgeon: Harvel Quale, MD;  [facility];  Service: Gastroenterology;  Laterality: N/A;  9:15   POLYPECTOMY  02/16/2021   Procedure: POLYPECTOMY;  Surgeon: Harvel Quale, MD;  [facility];  Service: Gastroenterology;;   ROTATOR CUFF REPAIR     Left   TRANSURETHRAL RESECTION OF PROSTATE N/A 02/06/2019   Procedure: TRANSURETHRAL RESECTION OF THE PROSTATE (TURP);  Surgeon: [name], MD;   [facility];  Service: Urology;  Laterality: N/A;   Current Facility-Administered Medications  Medication Dose Route Frequency Provider Last Rate Last Admin   0.9 %  sodium chloride infusion   Intravenous Continuous Mansouraty, Telford Nab., MD       lactated ringers infusion    Continuous PRN Mansouraty, Telford Nab., MD 10 mL/hr at 11/10/22 1027 Continued from Pre-op at 11/10/22 0712    Current Facility-Administered Medications:    0.9 %  sodium chloride infusion, , Intravenous, Continuous, Mansouraty, Telford Nab., MD   lactated ringers infusion, , , Continuous PRN, Mansouraty, Telford Nab., MD, Last Rate: 10 mL/hr at 11/10/22 2536, Continued from Pre-op at 11/10/22 6440 No Known Allergies History reviewed. No pertinent family history. Social History   Socioeconomic History   Marital status: Married    Spouse name: Not on file   Number of children: Not on file   Years of education: Not on file   Highest education level: Not on file  Occupational History   Not on file  Tobacco Use   Smoking status: Former   Smokeless tobacco: Former   Tobacco comments:    quit more than 20 years ago  Vaping Use   Vaping Use: Never used  Substance and Sexual Activity   Alcohol use: No   Drug use: No   Sexual activity: Not on file  Other Topics Concern   Not on file  Social History Narrative   Not on file   Social Determinants of Health  Financial Resource Strain: Not on file  Food Insecurity: Not on file  Transportation Needs: Not on file  Physical Activity: Not on file  Stress: Not on file  Social Connections: Not on file  Intimate Partner Violence: Not on file    Physical Exam: Today's Vitals   11/08/22 1500 11/10/22 0655  BP:  (!) 163/67  Pulse:  (!) 54  Resp:  14  Temp:  98.1 F (36.7 C)  TempSrc:  Tympanic  SpO2:  100%  Weight: 65.8 kg 65.8 kg  Height: '5\' 10"'$  (1.778 m) '5\' 10"'$  (1.778 m)  PainSc:  0-No pain   Body mass index is 20.81 kg/m. GEN: NAD EYE: Sclerae  anicteric ENT: MMM CV: Non-tachycardic GI: Soft, NT/ND NEURO:  Alert & Oriented x 3  Lab Results: No results for input(s): "WBC", "HGB", "HCT", "PLT" in the last 72 hours. BMET Recent Labs    11/10/22 0647  NA 142  K 4.1  CL 107  CO2 30  GLUCOSE 118*  BUN 23  CREATININE 1.24  CALCIUM 9.3   LFT Recent Labs    11/10/22 0647  PROT 7.1  ALBUMIN 4.1  AST 21  ALT 19  ALKPHOS 106  BILITOT 0.9   PT/INR No results for input(s): "LABPROT", "INR" in the last 72 hours.   Impression / Plan: This is a 88 y.o.male who presents for ERCP for attempt at choledocholithiasis removal.  The risks of an ERCP were discussed at length, including but not limited to the risk of perforation, bleeding, abdominal pain, post-ERCP pancreatitis (while usually mild can be severe and even life threatening).   The risks and benefits of endoscopic evaluation/treatment were discussed with the patient and/or family; these include but are not limited to the risk of perforation, infection, bleeding, missed lesions, lack of diagnosis, severe illness requiring hospitalization, as well as anesthesia and sedation related illnesses.  The patient's history has been reviewed, patient examined, no change in status, and deemed stable for procedure.  The patient and/or family is agreeable to proceed.    Justice Britain, MD [facility] Office # [phone]

## 2022-11-10 NOTE — Discharge Instructions (Signed)
YOU HAD AN ENDOSCOPIC PROCEDURE TODAY: Refer to the procedure report and other information in the discharge instructions given to you for any specific questions about what was found during the examination. If this information does not answer your questions, please call [place] office at [phone] to clarify.  ° °YOU SHOULD EXPECT: Some feelings of bloating in the abdomen. Passage of more gas than usual. Walking can help get rid of the air that was put into your GI tract during the procedure and reduce the bloating. If you had a lower endoscopy (such as a colonoscopy or flexible sigmoidoscopy) you may notice spotting of blood in your stool or on the toilet paper. Some abdominal soreness may be present for a day or two, also. ° °DIET: Your first meal following the procedure should be a light meal and then it is ok to progress to your normal diet. A half-sandwich or bowl of soup is an example of a good first meal. Heavy or fried foods are harder to digest and may make you feel nauseous or bloated. Drink plenty of fluids but you should avoid alcoholic beverages for 24 hours. If you had a esophageal dilation, please see attached instructions for diet.   ° °ACTIVITY: Your care partner should take you home directly after the procedure. You should plan to take it easy, moving slowly for the rest of the day. You can resume normal activity the day after the procedure however YOU SHOULD NOT DRIVE, use power tools, machinery or perform tasks that involve climbing or major physical exertion for 24 hours (because of the sedation medicines used during the test).  ° °SYMPTOMS TO REPORT IMMEDIATELY: °A gastroenterologist can be reached at any hour. Please call [phone]  for any of the following symptoms:  °Following lower endoscopy (colonoscopy, flexible sigmoidoscopy) °Excessive amounts of blood in the stool  °Significant tenderness, worsening of abdominal pains  °Swelling of the abdomen that is new, acute  °Fever of 100° or  higher  °Following upper endoscopy (EGD, EUS, ERCP, esophageal dilation) °Vomiting of blood or coffee ground material  °New, significant abdominal pain  °New, significant chest pain or pain under the shoulder blades  °Painful or persistently difficult swallowing  °New shortness of breath  °Black, tarry-looking or red, bloody stools ° °FOLLOW UP:  °If any biopsies were taken you will be contacted by phone or by letter within the next 1-3 weeks. Call [phone]  if you have not heard about the biopsies in 3 weeks.  °Please also call with any specific questions about appointments or follow up tests. ° °

## 2022-11-10 NOTE — Transfer of Care (Signed)
Immediate Anesthesia Transfer of Care Note  Patient: [name]  Procedure(s) Performed: ENDOSCOPIC RETROGRADE CHOLANGIOPANCREATOGRAPHY (ERCP) WITH PROPOFOL SPHINCTEROTOMY REMOVAL OF STONES PANCREATIC STENT PLACEMENT BIOPSY  Patient Location: Endoscopy Unit  Anesthesia Type:General  Level of Consciousness: awake, alert , and oriented  Airway & Oxygen Therapy: Patient Spontanous Breathing and Patient connected to face mask oxygen  Post-op Assessment: Report given to RN and Post -op Vital signs reviewed and stable  Post vital signs: Reviewed and stable  Last Vitals:  Vitals Value Taken Time  BP    Temp    Pulse 60 11/10/22 0858  Resp 14 11/10/22 0858  SpO2 100 % 11/10/22 0858  Vitals shown include unvalidated device data.  Last Pain:  Vitals:   11/10/22 0655  TempSrc: Tympanic  PainSc: 0-No pain         Complications: No notable events documented.

## 2022-11-10 NOTE — Telephone Encounter (Signed)
-----   Message from Irving Copas., MD sent at 11/10/2022  9:18 AM EST ----- Regarding: Follow-up [name], This patient needs a KUB and LFTs in 2 weeks.  Follow-up pancreatic stent make sure that it is fallen out.  LFTs to just see where things stand.  Patient is holding on referral to surgery for cholecystectomy at this point but should let us know in the next few weeks.  He can get these labs drawn at [place] or at [facility].  Please reach out to the patient's wife and patient in the next few days or early next week just to remind them. Please also forward my ERCP note to the patient's primary care doctor, Dr. Willey Blade. Thanks. GM

## 2022-11-10 NOTE — Anesthesia Postprocedure Evaluation (Signed)
Anesthesia Post Note  Patient: [name]  Procedure(s) Performed: ENDOSCOPIC RETROGRADE CHOLANGIOPANCREATOGRAPHY (ERCP) WITH PROPOFOL SPHINCTEROTOMY REMOVAL OF STONES PANCREATIC STENT PLACEMENT BIOPSY     Patient location during evaluation: PACU Anesthesia Type: General Level of consciousness: awake and alert Pain management: pain level controlled Vital Signs Assessment: post-procedure vital signs reviewed and stable Respiratory status: spontaneous breathing, nonlabored ventilation and respiratory function stable Cardiovascular status: blood pressure returned to baseline and stable Postop Assessment: no apparent nausea or vomiting Anesthetic complications: no   No notable events documented.  Last Vitals:  Vitals:   11/10/22 0907 11/10/22 0917  BP: 125/65 (!) 159/61  Pulse: 63 64  Resp: 18 15  Temp:    SpO2: 98% 97%    Last Pain:  Vitals:   11/10/22 0917  TempSrc:   PainSc: 0-No pain                 Lynda Rainwater

## 2022-11-10 NOTE — Anesthesia Procedure Notes (Signed)
Procedure Name: Intubation Date/Time: 11/10/2022 7:54 AM  Performed by: Maxwell Caul, CRNAPre-anesthesia Checklist: Patient identified, Emergency Drugs available, Suction available and Patient being monitored Patient Re-evaluated:Patient Re-evaluated prior to induction Oxygen Delivery Method: Circle system utilized Preoxygenation: Pre-oxygenation with 100% oxygen Induction Type: IV induction Ventilation: Mask ventilation without difficulty Laryngoscope Size: Mac and 4 Grade View: Grade I Tube type: Oral Tube size: 7.5 mm Number of attempts: 1 Airway Equipment and Method: Stylet Placement Confirmation: ETT inserted through vocal cords under direct vision, positive ETCO2 and breath sounds checked- equal and bilateral Secured at: 22 cm Tube secured with: Tape Dental Injury: Teeth and Oropharynx as per pre-operative assessment

## 2022-11-10 NOTE — Anesthesia Preprocedure Evaluation (Signed)
Anesthesia Evaluation  Patient identified by MRN, date of birth, ID band Patient awake    Reviewed: Allergy & Precautions, H&P , NPO status , Patient's Chart, lab work & pertinent test results, reviewed documented beta blocker date and time   Airway Mallampati: II  TM Distance: >3 FB Neck ROM: full    Dental no notable dental hx.    Pulmonary neg pulmonary ROS, former smoker   Pulmonary exam normal breath sounds clear to auscultation       Cardiovascular Exercise Tolerance: Good + CAD, + Past MI and + CABG   Rhythm:regular Rate:Normal     Neuro/Psych negative neurological ROS  negative psych ROS   GI/Hepatic negative GI ROS, Neg liver ROS,,,  Endo/Other  negative endocrine ROS    Renal/GU negative Renal ROS  negative genitourinary   Musculoskeletal   Abdominal   Peds  Hematology negative hematology ROS (+)   Anesthesia Other Findings   Reproductive/Obstetrics negative OB ROS                             Anesthesia Physical Anesthesia Plan  ASA: III  Anesthesia Plan: General   Post-op Pain Management: Minimal or no pain anticipated   Induction: Intravenous  PONV Risk Score and Plan: 2 and Ondansetron, Midazolam and Treatment may vary due to age or medical condition  Airway Management Planned: Oral ETT  Additional Equipment:   Intra-op Plan:   Post-operative Plan: Extubation in OR  Informed Consent: I have reviewed the patients History and Physical, chart, labs and discussed the procedure including the risks, benefits and alternatives for the proposed anesthesia with the patient or authorized representative who has indicated his/her understanding and acceptance.     Dental Advisory Given  Plan Discussed with: CRNA  Anesthesia Plan Comments:         Anesthesia Quick Evaluation

## 2022-11-10 NOTE — Op Note (Addendum)
[facility] Patient Name: [name] Procedure Date: 11/10/2022 MRN: [id number] Attending MD: Justice Britain , MD, [id number] Date of Birth: 1934-06-14 CSN: [id number] Age: 88 Admit Type: Outpatient Procedure:                ERCP Indications:              Common bile duct stone(s), Abnormal MRCP, Elevated                            liver enzymes Providers:                Justice Britain, MD, Kary Kos RN, RN, Hinton Dyer Technician, Technician Referring MD:             Asencion Noble, [name] Medicines:                General Anesthesia, Cipro 400 mg IV, Indomethacin                            409 mg PR Complications:            No immediate complications. Estimated Blood Loss:     Estimated blood loss was minimal. Procedure:                Pre-Anesthesia Assessment:                           - Prior to the procedure, a History and Physical                            was performed, and patient medications and                            allergies were reviewed. The patient's tolerance of                            previous anesthesia was also reviewed. The risks                            and benefits of the procedure and the sedation                            options and risks were discussed with the patient.                            All questions were answered, and informed consent                            was obtained. [Anticoagulant Agents] [BDZH Prior to                            [place]. [ASA Grade]. After reviewing the risks  and benefits, the patient was deemed in                            satisfactory condition to undergo the procedure.                           After obtaining informed consent, the scope was                            passed under direct vision. Throughout the                            procedure, the patient's blood pressure, pulse, and                             oxygen saturations were monitored continuously. The                            [id number]-H190 ([id number]) Olympus endoscope was introduced                            through the mouth, and used to inject contrast into                            and used to locate the major papilla. The [id number]-[id number]                            ([id number]) Olympus duodenoscope was introduced                            through the mouth, and used to inject contrast into                            and used to inject contrast into the bile duct. Scope In: Scope Out: Findings:      The scout film was normal.      The esophagus was successfully intubated under direct vision without       detailed examination of the pharynx, larynx, and associated structures.       A standard esophagogastroduodenoscopy scope was used for the examination       of the upper gastrointestinal tract. The scope was passed under direct       vision through the upper GI tract. No gross lesions were noted in the       entire esophagus. The Z-line was irregular and was found 42 cm from the       incisors. A 4 cm hiatal hernia was present. Striped moderately       erythematous mucosa without bleeding was found in the gastric antrum. No       other gross lesions were noted in the entire examined stomach. Biopsies       obtained to rule out HP. No gross lesions were noted in the duodenal       bulb, in the first portion of the duodenum and in the second portion of       the duodenum. The major papilla was normal.  On first attempt, the Jagwire ended up being placed within the       pancreatic duct. Decision was made to pursue a double-wire approach. The       wire was left within the pancreatic duct. A second short 0.035 inch Soft       Jagwire was passed into the biliary tree on 3rd wire-guided attempt. The       Hydratome sphincterotome was passed over the guidewire and the bile duct       was then deeply cannulated. Contrast was injected. I  personally       interpreted the bile duct images. Ductal flow of contrast was adequate.       Image quality was adequate. Contrast extended to the entire biliary       tree. Opacification of the entire biliary tree was successful. The main       bile duct was moderately dilated. The largest diameter was 10 mm. The       lower third of the main bile duct contained filling defect thought to be       a stone. A 10 mm biliary sphincterotomy was made with a monofilament       Hydratome sphincterotome using ERBE electrocautery. There was no       post-sphincterotomy bleeding. The biliary tree was swept with a       retrieval balloon. Sludge was swept from the duct. One stone was       removed. No stones remained. An occlusion cholangiogram was performed       that showed no further significant biliary pathology and the gallbladder       filled partially.      A pancreatogram was not performed.      The duodenoscope was withdrawn from the patient. Impression:               - No gross lesions in the entire esophagus. Z-line                            irregular, 42 cm from the incisors.                           - 4 cm hiatal hernia.                           - Erythematous mucosa in the antrum. No other gross                            lesions in the entire stomach. Biopsied.                           - No gross lesions in the duodenal bulb, in the                            first portion of the duodenum and in the second                            portion of the duodenum.                           - The major papilla appeared  normal.                           - Double-wire technique used for biliary                            cannulation.                           - A filling defect consistent with a stone was seen                            on the cholangiogram.                           - The entire main bile duct was moderately dilated.                           - Choledocholithiasis was  found. Complete removal                            was accomplished by biliary sphincterotomy and                            balloon trawl.                           - Gallbladder does fill. Moderate Sedation:      Not Applicable - Patient had care per Anesthesia. Recommendation:           - The patient will be observed post-procedure,                            until all discharge criteria are met.                           - Discharge patient to home.                           - Patient has a contact number available for                            emergencies. The signs and symptoms of potential                            delayed complications were discussed with the                            patient. Return to normal activities tomorrow.                            Written discharge instructions were provided to the                            patient.                           -  Low fat diet.                           - Observe patient's clinical course.                           - Check liver enzymes (AST, ALT, alkaline                            phosphatase, bilirubin) in 2 weeks.                           - Await path results.                           - Will increase Omeprazole to 40 mg daily for now.                           - Watch for pancreatitis, bleeding, perforation,                            and cholangitis.                           - Referral to Surgery to consider cholecystectomy                            is reasonable. If he is a non-surgical candidate,                            then would monitor and hope that sphincterotomy is                            enough to help prevent issues of                            choledocholithiasis in future. If he were to have                            recurrent bouts of choledocholithiasis, then will                            need to consider EUS-GB drainage vs ERCP DPS into                            gallbladder.                            - Patient will need a KUB 2-view in 10-14 days to                            ensure pancreatic stent has migrated successfully.                            If still present at that time will need  to be                            scheduled for EGD with stent pull.                           - The findings and recommendations were discussed                            with the patient.                           - The findings and recommendations were discussed                            with the patient's family. Procedure Code(s):        --- Professional ---                           [id number], Endoscopic retrograde                            cholangiopancreatography (ERCP); with removal of                            calculi/debris from biliary/pancreatic duct(s)                           43262, Endoscopic retrograde                            cholangiopancreatography (ERCP); with                            sphincterotomy/papillotomy                           43239, Esophagogastroduodenoscopy, flexible,                            transoral; with biopsy, single or multiple Diagnosis Code(s):        --- Professional ---                           K22.89, Other specified disease of esophagus                           K44.9, Diaphragmatic hernia without obstruction or                            gangrene                           K31.89, Other diseases of stomach and duodenum                           R93.2, Abnormal findings on diagnostic imaging of  liver and biliary tract                           K80.50, Calculus of bile duct without cholangitis                            or cholecystitis without obstruction                           R74.8, Abnormal levels of other serum enzymes                           K83.8, Other specified diseases of biliary tract CPT copyright 2022 American Medical Association. All rights reserved. The codes documented in this report  are preliminary and upon coder review may  be revised to meet current compliance requirements. Justice Britain, MD 11/10/2022 9:00:19 AM Number of Addenda: 0

## 2022-11-10 NOTE — Telephone Encounter (Signed)
ERCP report has been sent to Dr Willey Blade KUB and LFT has been entered will call pt in a week to make sure he completes these

## 2022-11-11 LAB — SURGICAL PATHOLOGY

## 2022-11-11 NOTE — Progress Notes (Signed)
Patient labs that would be scanned into the chart October 31, 2022 labs WBC 6.6 Hemoglobin/hematocrit 40.4/43.2 Platelets 215 MCV 95 Sodium 148 Potassium 6.0 BUN/creatinine 19/1.21 Albumin 4.6 AST/ALT 25/49 Alk phos 198 Total bili 0.5 Hemoglobin A1c 5.7 Magnesium 1.8 Phosphorus 3.5

## 2022-11-12 ENCOUNTER — Encounter: Payer: Self-pay | Admitting: Gastroenterology

## 2022-11-13 ENCOUNTER — Encounter (HOSPITAL_COMMUNITY): Payer: Self-pay | Admitting: Gastroenterology

## 2022-11-15 ENCOUNTER — Encounter: Payer: Self-pay | Admitting: Gastroenterology

## 2022-11-22 NOTE — Telephone Encounter (Signed)
Inbound call from patient wife requesting call back to discuss lab work and x-ray that needs to be done for patient. States patient hasn't done it yet as no one has made an appt for them to do so. Please advise.

## 2022-11-22 NOTE — Telephone Encounter (Signed)
The pt wife has been advised that no appt is needed -she thanked me for the call.

## 2022-11-29 ENCOUNTER — Ambulatory Visit (INDEPENDENT_AMBULATORY_CARE_PROVIDER_SITE_OTHER)
Admission: RE | Admit: 2022-11-29 | Discharge: 2022-11-29 | Disposition: A | Discharge status: Home / Self Care | Payer: Medicare HMO | Source: Ambulatory Visit | Attending: Gastroenterology | Admitting: Gastroenterology

## 2022-11-29 ENCOUNTER — Other Ambulatory Visit (INDEPENDENT_AMBULATORY_CARE_PROVIDER_SITE_OTHER): Payer: Medicare HMO

## 2022-11-29 DIAGNOSIS — Z9889 Other specified postprocedural states: Secondary | ICD-10-CM | POA: Diagnosis not present

## 2022-11-29 LAB — HEPATIC FUNCTION PANEL
ALT: 11 U/L (ref 0–53)
AST: 16 U/L (ref 0–37)
Albumin: 4.3 g/dL (ref 3.5–5.2)
Alkaline Phosphatase: 104 U/L (ref 39–117)
Bilirubin, Direct: 0.1 mg/dL (ref 0.0–0.3)
Total Bilirubin: 0.7 mg/dL (ref 0.2–1.2)
Total Protein: 6.9 g/dL (ref 6.0–8.3)

## 2022-12-01 ENCOUNTER — Telehealth (INDEPENDENT_AMBULATORY_CARE_PROVIDER_SITE_OTHER): Payer: Self-pay | Admitting: Gastroenterology

## 2022-12-01 NOTE — Telephone Encounter (Signed)
Patient was called for a follow up appointment - patient didn't quite understand the meaning so the wife put on the phone - she stated patient had not planned to come back to [facility]

## 2022-12-01 NOTE — Telephone Encounter (Signed)
Thanks  [place], they are coming to talk why he needs a cholecystectomy FYI.

## 2022-12-01 NOTE — Telephone Encounter (Signed)
Patient's wife called back and said they misunderstood the doctor and they wanted to go ahead and sch'd f/u apt - I have them sch'd for 12/05/22 at 330 with [facility]

## 2022-12-05 ENCOUNTER — Ambulatory Visit (INDEPENDENT_AMBULATORY_CARE_PROVIDER_SITE_OTHER): Payer: Medicare HMO | Admitting: Gastroenterology

## 2022-12-05 ENCOUNTER — Encounter (INDEPENDENT_AMBULATORY_CARE_PROVIDER_SITE_OTHER): Payer: Self-pay | Admitting: Gastroenterology

## 2022-12-05 VITALS — BP 132/80 | Temp 97.8°F | Ht 70.0 in | Wt 146.4 lb

## 2022-12-05 DIAGNOSIS — R1319 Other dysphagia: Secondary | ICD-10-CM | POA: Diagnosis not present

## 2022-12-05 DIAGNOSIS — Z9889 Other specified postprocedural states: Secondary | ICD-10-CM

## 2022-12-05 DIAGNOSIS — R151 Fecal smearing: Secondary | ICD-10-CM

## 2022-12-05 DIAGNOSIS — K219 Gastro-esophageal reflux disease without esophagitis: Secondary | ICD-10-CM | POA: Diagnosis not present

## 2022-12-05 NOTE — Progress Notes (Addendum)
Referring Provider: Asencion Noble, MD Primary Care Physician:  Roy Noble, MD Primary GI Physician: Jenetta Downer   Chief Complaint  Patient presents with   Dysphagia    Patient doing a routine follow up today. Reports esophagus feels tight at times. Has been careful to eat small bites and take sips of water between bites. Has appt January 2nd for surgical consult for gallbladder removal.    HPI:   [name] is a 88 y.o. male with past medical history of coronary artery disease, fecal incontinence, myocardial infarction and coronary artery disease status post CABG   Patient presenting today for follow up of dysphagia and recent ERCP for choledocoliathisis.  Last seen may 2022.  Previously having fecal soiling and dysphagia, Recommended to have EGD, perform abdominal x ray and refer for anorectal manometry. Rectal anomanometry/endoscopic US showed normal sphincter without presence of any tears but there was slight thinning of the external anal sphincter. He will benefit from biofeedback therapy, we will refer him to [facility] for further management of this. I also advised him to take Benefiber on a daily basis to increase the bulk of his stools to make the episodes of incontinence less frequent. Patient unable to get to [facility] due to the distance   At last visit, dyphagia improved since EGD, fecal soiling maybe 3x over the prior month, fiber and imodium did not provide much results. Advised to continue prilosec '20mg'$  BID.  Notably, patient underwent ERCP with Dr. Rogers Blocker in November at request of PCP Dr. Willey Blade due to findings of choledocolithiasis   KUB on 12/5 with nonobstructed gas pattern, large stool burden, no radio opaque stent visualizied, patient recommended to follow up with GEN surgery after ERCP to discuss GB removal.  Most Recent LFTs on 12/5 all WNL  Present: Patient states he is doing well since ERCP. He has upcoming appt with general surgery for evaluation of  GB removal in January. Denies abdominal pain, no nausea or vomiting.  He is having some issues with dysphagia, reports this is not very frequent, can take a sip of liquids and get food to pass when it does occur. He cannot tell me how often it is but Does not feel that this is occurring often enough that it is bothering him. Denies GERD symptoms, doing well on omeprazole '40mg'$  daily.   He continues to have some fecal soiling at times. Soiling can last 2-3 days when it occurs though he states this is not a weekly thing, seems to be intermittent. He is having a BM usually BID. Still has no desire to do biofeedback training at [facility]. Denies rectal bleeding or melena. No changes in appetite, he has lost about 14 pounds over the course of the past 2 years.   ERCP: 11/10/22: - No gross lesions in the entire esophagus. Z-line irregular, 42 cm from the incisors. - 4 cm hiatal hernia. - Erythematous mucosa in the antrum. No other gross lesions in the entire stomach. Biopsied. - No gross lesions in the duodenal bulb, in the first portion of the duodenum and in the second portion of the duodenum. - The major papilla appeared normal. - Double-wire technique used for biliary cannulation. - A filling defect consistent with a stone was seen on the cholangiogram. - The entire main bile duct was moderately dilated.  - Choledocholithiasis was found. Complete removal was accomplished by biliary sphincterotomy and  balloon trawl. - Gallbladder does fill. Last Endoscopy:feb 2022 No endoscopic esophageal abnormality to explain patient's dysphagia.  Esophagus dilated. Evidence of mucosal disruption - possible muscle bar in proximal esophagus. - 2 cm hiatal hernia. - A few gastric polyps. (Fundic gland polyps) - Normal examined duodenum.  Recommendations:    Past Medical History:  Diagnosis Date   Barrett's esophagus 01/25/2017   Coronary artery disease    Dysphagia 01/25/2017   hx of   Fecal incontinence     small amount leakage   Foley catheter in place    changed Dec 31 2018   Hyperlipidemia    Myocardial infarction [facility]) 1996or 1997   Urinary retention     Past Surgical History:  Procedure Laterality Date   BACK SURGERY     lower   BIOPSY  02/16/2021   Procedure: BIOPSY;  Surgeon: Harvel Quale, MD;  [facility];  Service: Gastroenterology;;   BIOPSY  11/10/2022   Procedure: BIOPSY;  Surgeon: Irving Copas., MD;  Location: [facility];  Service: Gastroenterology;;   cataract surgery     bilateral   CORONARY ARTERY BYPASS GRAFT     15 yrs ago x 5    ENDOSCOPIC RETROGRADE CHOLANGIOPANCREATOGRAPHY (ERCP) WITH PROPOFOL N/A 11/10/2022   Procedure: ENDOSCOPIC RETROGRADE CHOLANGIOPANCREATOGRAPHY (ERCP) WITH PROPOFOL;  Surgeon: Irving Copas., MD;  [facility];  Service: Gastroenterology;  Laterality: N/A;   ESOPHAGEAL DILATION N/A 02/17/2017   Procedure: ESOPHAGEAL DILATION;  Surgeon: Rogene Houston, MD;  [facility];  Service: Endoscopy;  Laterality: N/A;   ESOPHAGEAL DILATION N/A 02/16/2021   Procedure: ESOPHAGEAL DILATION;  Surgeon: Harvel Quale, MD;  [facility];  Service: Gastroenterology;  Laterality: N/A;   ESOPHAGOGASTRODUODENOSCOPY N/A 02/17/2017   Procedure: ESOPHAGOGASTRODUODENOSCOPY (EGD);  Surgeon: Rogene Houston, MD;  [facility];  Service: Endoscopy;  Laterality: N/A;  12:45   ESOPHAGOGASTRODUODENOSCOPY (EGD) WITH PROPOFOL N/A 02/16/2021   Procedure: ESOPHAGOGASTRODUODENOSCOPY (EGD) WITH PROPOFOL;  Surgeon: Harvel Quale, MD;  [facility];  Service: Gastroenterology;  Laterality: N/A;  9:15   PANCREATIC STENT PLACEMENT  11/10/2022   Procedure: PANCREATIC STENT PLACEMENT;  Surgeon: Irving Copas., MD;  Location: [facility];  Service: Gastroenterology;;   POLYPECTOMY  02/16/2021   Procedure: POLYPECTOMY;  Surgeon: Harvel Quale, MD;   [facility];  Service: Gastroenterology;;   REMOVAL OF STONES  11/10/2022   Procedure: REMOVAL OF STONES;  Surgeon: Irving Copas., MD;  Location: [facility];  Service: Gastroenterology;;   ROTATOR CUFF REPAIR     Left   SPHINCTEROTOMY  11/10/2022   Procedure: Joan Mayans;  Surgeon: Irving Copas., MD;  Location: [facility];  Service: Gastroenterology;;   TRANSURETHRAL RESECTION OF PROSTATE N/A 02/06/2019   Procedure: TRANSURETHRAL RESECTION OF THE PROSTATE (TURP);  Surgeon: [name], MD;  Location: [facility];  Service: Urology;  Laterality: N/A;    Current Outpatient Medications  Medication Sig Dispense Refill   acetaminophen (TYLENOL) 500 MG tablet Take 500-1,000 mg by mouth every 6 (six) hours as needed (pain).     aspirin EC 81 MG tablet Take 81 mg by mouth daily.     atenolol (TENORMIN) 25 MG tablet Take 25 mg by mouth daily.     Emollient (CETAPHIL) cream Apply 1 Application topically daily.     ezetimibe (ZETIA) 10 MG tablet Take 10 mg by mouth daily.     omeprazole (PRILOSEC) 40 MG capsule Take 1 capsule (40 mg total) by mouth daily. 30 capsule 12   rosuvastatin (CRESTOR) 20 MG tablet Take  20 mg by mouth once a week.     No current facility-administered medications for this visit.    Allergies as of 12/05/2022   (No Known Allergies)    No family history on file.  Social History   Socioeconomic History   Marital status: Married    Spouse name: Not on file   Number of children: Not on file   Years of education: Not on file   Highest education level: Not on file  Occupational History   Not on file  Tobacco Use   Smoking status: Former    Passive exposure: Never   Smokeless tobacco: Former   Tobacco comments:    quit more than 20 years ago  Vaping Use   Vaping Use: Never used  Substance and Sexual Activity   Alcohol use: No   Drug use: No   Sexual activity: Not on file  Other Topics Concern   Not on file  Social  History Narrative   Not on file   Social Determinants of Health   Financial Resource Strain: Not on file  Food Insecurity: Not on file  Transportation Needs: Not on file  Physical Activity: Not on file  Stress: Not on file  Social Connections: Not on file   Review of systems General: negative for malaise, night sweats, fever, chills, weight loss Neck: Negative for lumps, goiter, pain and significant neck swelling Resp: Negative for cough, wheezing, dyspnea at rest CV: Negative for chest pain, leg swelling, palpitations, orthopnea GI: denies melena, hematochezia, nausea, vomiting, diarrhea, constipation, dysphagia, odyonophagia, early satiety or unintentional weight loss. +fecal soiling +dysphagia  MSK: Negative for joint pain or swelling, back pain, and muscle pain. Derm: Negative for itching or rash Psych: Denies depression, anxiety, memory loss, confusion. No homicidal or suicidal ideation.  Heme: Negative for prolonged bleeding, bruising easily, and swollen nodes. Endocrine: Negative for cold or heat intolerance, polyuria, polydipsia and goiter. Neuro: negative for tremor, gait imbalance, syncope and seizures. The remainder of the review of systems is noncontributory.  Physical Exam: BP 132/80 (BP Location: Right Arm, Patient Position: Sitting, Cuff Size: Normal)   Temp 97.8 F (36.6 C) (Oral)   Ht '5\' 10"'$  (1.778 m)   Wt 146 lb 6.4 oz (66.4 kg)   BMI 21.01 kg/m  General:   Alert and oriented. No distress noted. Pleasant and cooperative.  Head:  Normocephalic and atraumatic. Eyes:  Conjuctiva clear without scleral icterus. Mouth:  Oral mucosa pink and moist. Good dentition. No lesions. Heart: Normal rate and rhythm, s1 and s2 heart sounds present.  Lungs: Clear lung sounds in all lobes. Respirations equal and unlabored. Abdomen:  +BS, soft, non-tender and non-distended. No rebound or guarding. No HSM or masses noted. Derm: No palmar erythema or jaundice Msk:  Symmetrical  without gross deformities. Normal posture. Extremities:  Without edema. Neurologic:  Alert and  oriented x4 Psych:  Alert and cooperative. Normal mood and affect.  Invalid input(s): "6 MONTHS"   ASSESSMENT: [name] is a 88 y.o. male presenting today for follow up of Dysphagia and on previous choledocolithaisis requiring ERCP.  Doing well since ERCP, recent KUB without presence of stent and LFTs WNL on 12/5. He has no nausea, vomiting or abdominal pain.  He has appointment with general surgery regarding cholecystectomy in January.  I encouraged him to keep this appointment.  He Will make me aware if he has any nausea, vomiting, abdominal pain, jaundice, pruritus.  He has dysphagia, cannot pinpoint how often that he  does not feel like it is occurring often enough to be of much issue to him.  Notes he can take a sip of liquids to get food to pass when it does occur.  He is maintained on omeprazole 40 mg once daily, he has no GERD symptoms.  Will keep an eye on symptoms of dysphagia and he will make me aware if this worsens.   Also continues to have intermittent fecal soiling, previous anorectal manometry showed thickening of 1 layers of muscle which she was recommended to have biofeedback training however patient did not wish to go to [facility] for this.  Patient still have no desire to do biofeedback training at [facility], previous use of Imodium and bulking fiber supplements did not make much difference for him.   PLAN:  Continue with gen surg consult for GB removal  2. Continue omeprazole '40mg'$  daily  3. Patient to let me know if dysphagia worsens, chewing precautions  4. Make me aware of nausea, vomiting, abdominal pain, jaundice or pruritus.  All questions were answered, patient verbalized understanding and is in agreement with plan as outlined above.   Follow Up: 6 months   Roy Crosley L. Alver Sorrow, MSN, APRN, AGNP-C Adult-Gerontology Nurse Practitioner [facility]  I have reviewed the note and agree with the APP's assessment as described in this progress note  [name], MD Gastroenterology and Hepatology [facility] Gastroenterology

## 2022-12-05 NOTE — Patient Instructions (Signed)
Please keep appointment with general surgery regarding removal of gallbladder If you have any abdominal pain, nausea or vomiting, please let us know Continue omeprazole '40mg'$  once daily and make me aware if swallowing becomes worse  Follow up 6 months

## 2022-12-27 ENCOUNTER — Other Ambulatory Visit: Payer: Self-pay | Admitting: General Surgery

## 2022-12-27 MED ORDER — SPY AGENT GREEN - (INDOCYANINE FOR INJECTION): 1.2500 mg | Freq: Once | INTRAMUSCULAR | Status: AC

## 2022-12-29 ENCOUNTER — Telehealth: Payer: Self-pay | Admitting: Gastroenterology

## 2022-12-29 NOTE — Telephone Encounter (Addendum)
Dr. Knute Neu office called regarding this patient, receptionist said Dr. Willey Blade would like to speak with Dr. Rush Landmark and asked for his cell number. Per Koren Shiver, RN ok to provide them with the number. She also provided Dr. Willey Blade cell [phone].

## 2022-12-30 NOTE — Telephone Encounter (Signed)
Called and spoke with Dr. Willey Blade today and he was updating me that patient has done quite well since his ERCP. Thanks. GM

## 2023-01-03 ENCOUNTER — Encounter (HOSPITAL_COMMUNITY): Payer: Self-pay | Admitting: General Surgery

## 2023-01-03 NOTE — Anesthesia Preprocedure Evaluation (Addendum)
Anesthesia Evaluation  Patient identified by MRN, date of birth, ID band Patient awake    Reviewed: Allergy & Precautions, NPO status , Patient's Chart, lab work & pertinent test results, reviewed documented beta blocker date and time   History of Anesthesia Complications Negative for: history of anesthetic complications  Airway Mallampati: III  TM Distance: >3 FB Neck ROM: Full   Comment: Previous grade I view with MAC 4, easy mask Dental  (+) Dental Advisory Given Missing several teeth on the sides. Denies loose teeth.:   Pulmonary neg shortness of breath, neg sleep apnea, neg COPD, neg recent URI, former smoker   Pulmonary exam normal breath sounds clear to auscultation       Cardiovascular (-) hypertension(-) angina + CAD, + Past MI (~1996) and + CABG (1996)  (-) dysrhythmias  Rhythm:Regular Rate:Normal  HLD   Neuro/Psych negative neurological ROS     GI/Hepatic Neg liver ROS,GERD  Medicated,,  Endo/Other  diabetes, Type 2    Renal/GU CRFRenal disease     Musculoskeletal   Abdominal   Peds  Hematology negative hematology ROS (+)   Anesthesia Other Findings   Reproductive/Obstetrics                             Anesthesia Physical Anesthesia Plan  ASA: 3  Anesthesia Plan: General   Post-op Pain Management: Regional block* and Tylenol PO (pre-op)*   Induction: Intravenous  PONV Risk Score and Plan: 2 and Ondansetron, Dexamethasone and Treatment may vary due to age or medical condition  Airway Management Planned: Oral ETT  Additional Equipment:   Intra-op Plan:   Post-operative Plan: Extubation in OR  Informed Consent: I have reviewed the patients History and Physical, chart, labs and discussed the procedure including the risks, benefits and alternatives for the proposed anesthesia with the patient or authorized representative who has indicated his/her understanding and  acceptance.     Dental advisory given  Plan Discussed with:   Anesthesia Plan Comments: (Risks of general anesthesia discussed including, but not limited to, sore throat, hoarse voice, chipped/damaged teeth, injury to vocal cords, nausea and vomiting, allergic reactions, lung infection, heart attack, stroke, and death. All questions answered.  Discussed potential risks of nerve blocks including, but not limited to, infection, bleeding, nerve damage, seizures, pneumothorax, respiratory depression, and potential failure of the block. Alternatives to nerve blocks discussed. All questions answered.   PAT note written 01/03/2023 by Myra Gianotti, PA-C.  )       Anesthesia Quick Evaluation

## 2023-01-03 NOTE — Progress Notes (Signed)
Anesthesia Chart Review: [name]  Case: [id number] Date/Time: 01/04/23 0815   Procedures:      RIGHT INGUINAL HERNIA REPAIR WITH MESH (Right) - GEN & TAP BLOCK     LAPAROSCOPIC CHOLECYSTECTOMY WITH ICG DYE   Anesthesia type: General   Pre-op diagnosis: GALLSTONES, RIGHT INGUINAL HERNIA   Location: [facility] ROOM 01 / [facility]   Surgeons: Rolm Bookbinder, MD       DISCUSSION: Patient is an 89 year old male scheduled for the above procedure.  History includes former smoker (also quit smokeless tobacco 12/26/93), CAD (MI, s/p CABG 1996), HLD, CKD, GERD, urinary retention, dysphagia (2018), spinal surgery (L4-5 laminectomy 10/19/06), BPH (s/p TURP 02/06/19),   S/p EGD/ERCP on 11/10/22 for abdominal pain, elevated LFTs, and dilated common bile duct on MRI. EGD showed no gross lesions in the esophagus, stomach, or visualized duodenum, a 4 cm hiatal hernia, and choledocholithiasis with dilated bile duct, s/p biliary sphincterotomy and balloon trawl, post pancreatic stent placement. Xray on 11/29/22 showed that the pancreas stent had fallen out. LFTs had normalized. He was referred for cholecystectomy to prevent other issues given previous choledocholithiasis. He was evaluated by Dr. Donne Hall on 12/27/22. His abdominal pain had improved, but having right groin pain that was preventing him for doing activities. A right inguinal hernia was noted. Dr. Donne Hall recommended right inguinal hernia repair and cholecystectomy.   LFTs on 11/29/22 were within normal limits. Creatinine 1.24, glucose 118 on 11/10/22. Last CBC and EKG currently available are > 4 year old.   He had MI and CABG in 1996. He has not been followed by cardiology in years. Last stress test noted was from 2010 showed evidence of prior large inferior infarct with mild inferoseptal wall hypokinesis, EF 53%, no ischemia. I called and spoke with him. He reported that his CABG was done at [facility]. He says he was released from cardiology many years ago  and is followed by his PCP Dr. Willey Blade. He denied any chest pain, SOB, edema, palpitations, edema. He reports that he is able to be active and within the past six months has been able to do yard work and walks around 30 minutes at a steady pace with small inclines along the way without CV symptoms. He denied having an EKG within the past year.   Discussed with anesthesiologist Tamela Gammon, MD. Patient with known CAD with remote CABG in 1996. He is no longer followed by cardiology (for many years) but remains asymptomatic and able to be active (walking for exercise for at least 30 minutes) without CV symptoms. Anesthesia team to evaluate on the day of surgery. Labs and EKG on arrival as indicated.    VS: Ht '5\' 10"'$  (1.778 m)   Wt 66.4 kg   BMI 21.00 kg/m  BP Readings from Last 3 Encounters:  12/05/22 132/80  11/10/22 (!) 159/61  02/16/21 133/84   Pulse Readings from Last 3 Encounters:  11/10/22 64  01/07/21 66  09/07/20 (!) 52     PROVIDERS: Asencion Noble, MD is PCP  Montez Morita, MD is GI   LABS: For day of procedure as indicated.    IMAGES: Xray Abd 11/29/22: FINDINGS: Nonobstructed gas pattern with large stool burden. Postsurgical changes in the epigastric area. No radiopaque stent material is visualized. No radiopaque calculi. IMPRESSION: Nonobstructed gas pattern with large stool burden. No radiopaque stent material is visualized.  ERCP 11/10/22: IMPRESSION: 1. ERCP with findings of choledocholithiasis with subsequent biliary sweeping sphincterotomy and stone extraction. 2.  Post pancreatic stent placement.   EKG: Last EKG currently available is > 65 year old.  Last noted tracing 02/01/19: Sinus bradycardia with Premature atrial complexes Right bundle branch block Lateral infarct , age undetermined Abnormal ECG Since previous tracing Lateral Q waves noted Confirmed by Granger, Will [phone]) on 02/03/2019 6:02:11 PM  CV: Nuclear stress test 04/06/09: IMPRESSION:  1.  No evidence of reversible ischemia.  2.  Large infarct in the inferior wall and inferior septal wall.  3.  Mild hypokinesis of the inferior wall and septal wall.  4.  Ejection fraction equals 53% which compares to 55% on prior.    Past Medical History:  Diagnosis Date   Barrett's esophagus 01/25/2017   Chronic kidney disease    stage 3   Coronary artery disease    Diabetes mellitus without complication ([place])    type 2, no meds, patient does  not know about this dx but on Dr Ria Comment record   Dysphagia 01/25/2017   hx of   Fecal incontinence    small amount leakage   Foley catheter in place    changed Dec 31 2018   GERD (gastroesophageal reflux disease)    Hyperlipidemia    Myocardial infarction [facility]) 1996or 1997   Pneumonia    Urinary retention     Past Surgical History:  Procedure Laterality Date   BACK SURGERY     lower   BIOPSY  02/16/2021   Procedure: BIOPSY;  Surgeon: Harvel Quale, MD;  [facility];  Service: Gastroenterology;;   BIOPSY  11/10/2022   Procedure: BIOPSY;  Surgeon: Irving Copas., MD;  Location: [facility];  Service: Gastroenterology;;   cataract surgery     bilateral   CORONARY ARTERY BYPASS GRAFT  1996   15 yrs ago x 5    ENDOSCOPIC RETROGRADE CHOLANGIOPANCREATOGRAPHY (ERCP) WITH PROPOFOL N/A 11/10/2022   Procedure: ENDOSCOPIC RETROGRADE CHOLANGIOPANCREATOGRAPHY (ERCP) WITH PROPOFOL;  Surgeon: Irving Copas., MD;  Location: [facility];  Service: Gastroenterology;  Laterality: N/A;   ESOPHAGEAL DILATION N/A 02/17/2017   Procedure: ESOPHAGEAL DILATION;  Surgeon: Rogene Houston, MD;  [facility];  Service: Endoscopy;  Laterality: N/A;   ESOPHAGEAL DILATION N/A 02/16/2021   Procedure: ESOPHAGEAL DILATION;  Surgeon: Harvel Quale, MD;  [facility];  Service: Gastroenterology;  Laterality: N/A;   ESOPHAGOGASTRODUODENOSCOPY N/A 02/17/2017   Procedure:  ESOPHAGOGASTRODUODENOSCOPY (EGD);  Surgeon: Rogene Houston, MD;  [facility];  Service: Endoscopy;  Laterality: N/A;  12:45   ESOPHAGOGASTRODUODENOSCOPY (EGD) WITH PROPOFOL N/A 02/16/2021   Procedure: ESOPHAGOGASTRODUODENOSCOPY (EGD) WITH PROPOFOL;  Surgeon: Harvel Quale, MD;  [facility];  Service: Gastroenterology;  Laterality: N/A;  9:15   PANCREATIC STENT PLACEMENT  11/10/2022   Procedure: PANCREATIC STENT PLACEMENT;  Surgeon: Irving Copas., MD;  Location: [facility];  Service: Gastroenterology;;   POLYPECTOMY  02/16/2021   Procedure: POLYPECTOMY;  Surgeon: Harvel Quale, MD;  [facility];  Service: Gastroenterology;;   REMOVAL OF STONES  11/10/2022   Procedure: REMOVAL OF STONES;  Surgeon: Irving Copas., MD;  Location: [facility];  Service: Gastroenterology;;   ROTATOR CUFF REPAIR     Left   SPHINCTEROTOMY  11/10/2022   Procedure: Joan Mayans;  Surgeon: Irving Copas., MD;  Location: [facility];  Service: Gastroenterology;;   TRANSURETHRAL RESECTION OF PROSTATE N/A 02/06/2019   Procedure: TRANSURETHRAL RESECTION OF THE PROSTATE (TURP);  Surgeon: [name], MD;  Location: [facility];  Service: Urology;  Laterality: N/A;    MEDICATIONS: No current facility-administered medications for this encounter.    acetaminophen (TYLENOL) 500 MG tablet   aspirin EC 81 MG tablet   atenolol (TENORMIN) 25 MG tablet   Emollient (CETAPHIL) cream   ezetimibe (ZETIA) 10 MG tablet   omeprazole (PRILOSEC) 40 MG capsule   rosuvastatin (CRESTOR) 20 MG tablet    Spy Agent Green / Firefly Optime    Myra Gianotti, PA-C Surgical Short Stay/Anesthesiology [facility] Phone [phone] [facility] Phone [phone] 01/03/2023 1:55 PM

## 2023-01-03 NOTE — Progress Notes (Signed)
PCP - Dr Asencion Noble, III Cardiologist - n/a  Chest x-ray - n/a EKG - DOS Stress Test - 04/06/09, 02/05/02 ECHO - n/a Cardiac Cath - Yes  ICD Pacemaker/Loop - n/a  Sleep Study -  n/a CPAP - none  Aspirin Instructions: Follow your surgeon's instructions on when to stop aspirin prior to surgery,  If no instructions were given by your surgeon then you will need to call the office for those instructions.  ERAS: Clear liquids til 5:30 AM DOS.  Anesthesia review: Yes  STOP now taking any Aspirin (unless otherwise instructed by your surgeon), Aleve, Naproxen, Ibuprofen, Motrin, Advil, Goody's, BC's, all herbal medications, fish oil, and all vitamins.   Coronavirus Screening Do you have any of the following symptoms:  Cough yes/no: No Fever (>100.46F)  yes/no: No Runny nose yes/no: No Sore throat yes/no: No Difficulty breathing/shortness of breath  yes/no: No  Have you traveled in the last 14 days and where? yes/no: No  Patient's Wife Rosilee verbalized understanding of instructions that were given via phone.

## 2023-01-04 ENCOUNTER — Other Ambulatory Visit: Payer: Self-pay

## 2023-01-04 ENCOUNTER — Ambulatory Visit (HOSPITAL_COMMUNITY): Payer: Medicare HMO | Admitting: Physician Assistant

## 2023-01-04 ENCOUNTER — Observation Stay (HOSPITAL_COMMUNITY)
Admission: RE | Admit: 2023-01-04 | Discharge: 2023-01-05 | Disposition: A | Discharge status: Home / Self Care | Payer: Medicare HMO | Attending: General Surgery | Admitting: General Surgery

## 2023-01-04 ENCOUNTER — Encounter (HOSPITAL_COMMUNITY)
Admission: RE | Disposition: A | Discharge status: Home / Self Care | Payer: Self-pay | Source: Home / Self Care | Attending: General Surgery

## 2023-01-04 ENCOUNTER — Encounter (HOSPITAL_COMMUNITY): Payer: Self-pay | Admitting: General Surgery

## 2023-01-04 DIAGNOSIS — Z87891 Personal history of nicotine dependence: Secondary | ICD-10-CM

## 2023-01-04 DIAGNOSIS — K4090 Unilateral inguinal hernia, without obstruction or gangrene, not specified as recurrent: Secondary | ICD-10-CM

## 2023-01-04 DIAGNOSIS — I2510 Atherosclerotic heart disease of native coronary artery without angina pectoris: Secondary | ICD-10-CM

## 2023-01-04 DIAGNOSIS — K8012 Calculus of gallbladder with acute and chronic cholecystitis without obstruction: Secondary | ICD-10-CM | POA: Diagnosis not present

## 2023-01-04 DIAGNOSIS — I252 Old myocardial infarction: Secondary | ICD-10-CM | POA: Diagnosis not present

## 2023-01-04 DIAGNOSIS — I10 Essential (primary) hypertension: Secondary | ICD-10-CM | POA: Diagnosis not present

## 2023-01-04 DIAGNOSIS — Z79899 Other long term (current) drug therapy: Secondary | ICD-10-CM | POA: Insufficient documentation

## 2023-01-04 DIAGNOSIS — K811 Chronic cholecystitis: Secondary | ICD-10-CM

## 2023-01-04 DIAGNOSIS — Z7982 Long term (current) use of aspirin: Secondary | ICD-10-CM | POA: Insufficient documentation

## 2023-01-04 DIAGNOSIS — Z9049 Acquired absence of other specified parts of digestive tract: Secondary | ICD-10-CM

## 2023-01-04 HISTORY — DX: Pneumonia, unspecified organism: J18.9

## 2023-01-04 HISTORY — DX: Gastro-esophageal reflux disease without esophagitis: K21.9

## 2023-01-04 HISTORY — PX: CHOLECYSTECTOMY: SHX55

## 2023-01-04 HISTORY — DX: Type 2 diabetes mellitus without complications: E11.9

## 2023-01-04 HISTORY — DX: Chronic kidney disease, unspecified: N18.9

## 2023-01-04 HISTORY — PX: INGUINAL HERNIA REPAIR: SHX194

## 2023-01-04 LAB — CBC
HCT: 40.5 % (ref 39.0–52.0)
Hemoglobin: 13.8 g/dL (ref 13.0–17.0)
MCH: 31.7 pg (ref 26.0–34.0)
MCHC: 34.1 g/dL (ref 30.0–36.0)
MCV: 93.1 fL (ref 80.0–100.0)
Platelets: 147 10*3/uL — ABNORMAL LOW (ref 150–400)
RBC: 4.35 MIL/uL (ref 4.22–5.81)
RDW: 12.9 % (ref 11.5–15.5)
WBC: 4.9 10*3/uL (ref 4.0–10.5)
nRBC: 0.4 % — ABNORMAL HIGH (ref 0.0–0.2)

## 2023-01-04 LAB — BASIC METABOLIC PANEL
Anion gap: 8 (ref 5–15)
BUN: 17 mg/dL (ref 8–23)
CO2: 25 mmol/L (ref 22–32)
Calcium: 8.9 mg/dL (ref 8.9–10.3)
Chloride: 106 mmol/L (ref 98–111)
Creatinine, Ser: 1.27 mg/dL — ABNORMAL HIGH (ref 0.61–1.24)
GFR, Estimated: 55 mL/min — ABNORMAL LOW (ref >60)
Glucose, Bld: 109 mg/dL — ABNORMAL HIGH (ref 70–99)
Potassium: 4.5 mmol/L (ref 3.5–5.1)
Sodium: 139 mmol/L (ref 135–145)

## 2023-01-04 SURGERY — REPAIR, HERNIA, INGUINAL, ADULT
Anesthesia: General | Laterality: Right

## 2023-01-04 MED ORDER — SIMETHICONE 80 MG PO CHEW: 40.0000 mg | CHEWABLE_TABLET | Freq: Four times a day (QID) | ORAL | Status: DC | PRN

## 2023-01-04 MED ORDER — LIDOCAINE 2% (20 MG/ML) 5 ML SYRINGE
INTRAMUSCULAR | Status: AC
  Filled 2023-01-04: qty 5

## 2023-01-04 MED ORDER — OXYCODONE HCL 5 MG PO TABS
ORAL_TABLET | ORAL | Status: AC
  Filled 2023-01-04: qty 1

## 2023-01-04 MED ORDER — LIDOCAINE-EPINEPHRINE 1 %-1:100000 IJ SOLN
INTRAMUSCULAR | Status: AC
  Filled 2023-01-04: qty 1

## 2023-01-04 MED ORDER — METHOCARBAMOL 1000 MG/10ML IJ SOLN: 500.0000 mg | Freq: Three times a day (TID) | INTRAVENOUS | Status: DC | PRN

## 2023-01-04 MED ORDER — ASPIRIN 81 MG PO TBEC
81.0000 mg | DELAYED_RELEASE_TABLET | Freq: Every day | ORAL | Status: DC
  Administered 2023-01-05: 81 mg via ORAL
  Filled 2023-01-04: qty 1

## 2023-01-04 MED ORDER — ACETAMINOPHEN 500 MG PO TABS: 1000.0000 mg | ORAL_TABLET | ORAL | Status: AC

## 2023-01-04 MED ORDER — EZETIMIBE 10 MG PO TABS
10.0000 mg | ORAL_TABLET | Freq: Every day | ORAL | Status: DC
  Administered 2023-01-05: 10 mg via ORAL
  Filled 2023-01-04: qty 1

## 2023-01-04 MED ORDER — ENOXAPARIN SODIUM 40 MG/0.4ML IJ SOSY
40.0000 mg | PREFILLED_SYRINGE | INTRAMUSCULAR | Status: DC
  Administered 2023-01-05: 40 mg via SUBCUTANEOUS
  Filled 2023-01-04: qty 0.4

## 2023-01-04 MED ORDER — ACETAMINOPHEN 500 MG PO TABS
ORAL_TABLET | ORAL | Status: AC
  Administered 2023-01-04: 1000 mg via ORAL
  Filled 2023-01-04: qty 2

## 2023-01-04 MED ORDER — PANTOPRAZOLE SODIUM 40 MG PO TBEC
40.0000 mg | DELAYED_RELEASE_TABLET | Freq: Every day | ORAL | Status: DC
  Administered 2023-01-05: 40 mg via ORAL
  Filled 2023-01-04: qty 1

## 2023-01-04 MED ORDER — AMISULPRIDE (ANTIEMETIC) 5 MG/2ML IV SOLN: 10.0000 mg | Freq: Once | INTRAVENOUS | Status: DC | PRN

## 2023-01-04 MED ORDER — PROPOFOL 10 MG/ML IV BOLUS
INTRAVENOUS | Status: AC
  Filled 2023-01-04: qty 20

## 2023-01-04 MED ORDER — BUPIVACAINE HCL (PF) 0.25 % IJ SOLN
INTRAMUSCULAR | Status: AC
  Filled 2023-01-04: qty 30

## 2023-01-04 MED ORDER — OXYCODONE HCL 5 MG/5ML PO SOLN: 5.0000 mg | Freq: Once | ORAL | Status: AC | PRN

## 2023-01-04 MED ORDER — DEXAMETHASONE SODIUM PHOSPHATE 10 MG/ML IJ SOLN
INTRAMUSCULAR | Status: AC
  Filled 2023-01-04: qty 1

## 2023-01-04 MED ORDER — ACETAMINOPHEN 500 MG PO TABS
1000.0000 mg | ORAL_TABLET | Freq: Four times a day (QID) | ORAL | Status: DC
  Administered 2023-01-04 – 2023-01-05 (×4): 1000 mg via ORAL
  Filled 2023-01-04 (×3): qty 2

## 2023-01-04 MED ORDER — PROPOFOL 10 MG/ML IV BOLUS
INTRAVENOUS | Status: DC | PRN
  Administered 2023-01-04: 120 mg via INTRAVENOUS

## 2023-01-04 MED ORDER — ROSUVASTATIN CALCIUM 20 MG PO TABS: 20.0000 mg | ORAL_TABLET | ORAL | Status: DC

## 2023-01-04 MED ORDER — ATENOLOL 25 MG PO TABS
25.0000 mg | ORAL_TABLET | Freq: Every day | ORAL | Status: DC
  Administered 2023-01-05: 25 mg via ORAL
  Filled 2023-01-04: qty 1

## 2023-01-04 MED ORDER — CEFAZOLIN SODIUM 1 G IJ SOLR
INTRAMUSCULAR | Status: AC
  Filled 2023-01-04: qty 20

## 2023-01-04 MED ORDER — CHLORHEXIDINE GLUCONATE 0.12 % MT SOLN: 15.0000 mL | Freq: Once | OROMUCOSAL | Status: AC

## 2023-01-04 MED ORDER — ONDANSETRON HCL 4 MG/2ML IJ SOLN
INTRAMUSCULAR | Status: AC
  Filled 2023-01-04: qty 2

## 2023-01-04 MED ORDER — EPHEDRINE SULFATE-NACL 50-0.9 MG/10ML-% IV SOSY
PREFILLED_SYRINGE | INTRAVENOUS | Status: DC | PRN
  Administered 2023-01-04: 10 mg via INTRAVENOUS
  Administered 2023-01-04: 5 mg via INTRAVENOUS

## 2023-01-04 MED ORDER — ONDANSETRON 4 MG PO TBDP: 4.0000 mg | ORAL_TABLET | Freq: Four times a day (QID) | ORAL | Status: DC | PRN

## 2023-01-04 MED ORDER — FENTANYL CITRATE (PF) 100 MCG/2ML IJ SOLN
INTRAMUSCULAR | Status: AC
  Filled 2023-01-04: qty 2

## 2023-01-04 MED ORDER — FENTANYL CITRATE (PF) 250 MCG/5ML IJ SOLN
INTRAMUSCULAR | Status: AC
  Filled 2023-01-04: qty 5

## 2023-01-04 MED ORDER — ROCURONIUM BROMIDE 10 MG/ML (PF) SYRINGE
PREFILLED_SYRINGE | INTRAVENOUS | Status: DC | PRN
  Administered 2023-01-04 (×2): 20 mg via INTRAVENOUS
  Administered 2023-01-04: 40 mg via INTRAVENOUS
  Administered 2023-01-04: 20 mg via INTRAVENOUS

## 2023-01-04 MED ORDER — DEXAMETHASONE SODIUM PHOSPHATE 10 MG/ML IJ SOLN
INTRAMUSCULAR | Status: DC | PRN
  Administered 2023-01-04: 5 mg via INTRAVENOUS

## 2023-01-04 MED ORDER — LIDOCAINE 2% (20 MG/ML) 5 ML SYRINGE
INTRAMUSCULAR | Status: DC | PRN
  Administered 2023-01-04: 80 mg via INTRAVENOUS

## 2023-01-04 MED ORDER — SODIUM CHLORIDE 0.9 % IV SOLN: INTRAVENOUS | Status: DC

## 2023-01-04 MED ORDER — CEFAZOLIN SODIUM-DEXTROSE 2-4 GM/100ML-% IV SOLN
2.0000 g | INTRAVENOUS | Status: AC
  Administered 2023-01-04: 2 g via INTRAVENOUS

## 2023-01-04 MED ORDER — CHLORHEXIDINE GLUCONATE 0.12 % MT SOLN
OROMUCOSAL | Status: AC
  Administered 2023-01-04: 15 mL via OROMUCOSAL
  Filled 2023-01-04: qty 15

## 2023-01-04 MED ORDER — FENTANYL CITRATE (PF) 100 MCG/2ML IJ SOLN
INTRAMUSCULAR | Status: DC | PRN
  Administered 2023-01-04: 50 ug via INTRAVENOUS
  Administered 2023-01-04 (×3): 25 ug via INTRAVENOUS
  Administered 2023-01-04: 50 ug via INTRAVENOUS

## 2023-01-04 MED ORDER — BUPIVACAINE-EPINEPHRINE (PF) 0.5% -1:200000 IJ SOLN
INTRAMUSCULAR | Status: AC
  Filled 2023-01-04: qty 30

## 2023-01-04 MED ORDER — INDOCYANINE GREEN 25 MG IV SOLR
INTRAVENOUS | Status: AC
  Filled 2023-01-04: qty 10

## 2023-01-04 MED ORDER — BUPIVACAINE HCL (PF) 0.25 % IJ SOLN
INTRAMUSCULAR | Status: DC | PRN
  Administered 2023-01-04 (×2): 30 mL via PERINEURAL

## 2023-01-04 MED ORDER — CHLORHEXIDINE GLUCONATE CLOTH 2 % EX PADS: 6.0000 | MEDICATED_PAD | Freq: Once | CUTANEOUS | Status: DC

## 2023-01-04 MED ORDER — LACTATED RINGERS IV SOLN: INTRAVENOUS | Status: DC

## 2023-01-04 MED ORDER — ROCURONIUM BROMIDE 10 MG/ML (PF) SYRINGE
PREFILLED_SYRINGE | INTRAVENOUS | Status: AC
  Filled 2023-01-04: qty 10

## 2023-01-04 MED ORDER — OXYCODONE HCL 5 MG PO TABS
5.0000 mg | ORAL_TABLET | Freq: Once | ORAL | Status: AC | PRN
  Administered 2023-01-04: 5 mg via ORAL

## 2023-01-04 MED ORDER — FENTANYL CITRATE (PF) 100 MCG/2ML IJ SOLN
25.0000 ug | INTRAMUSCULAR | Status: DC | PRN
  Administered 2023-01-04: 50 ug via INTRAVENOUS

## 2023-01-04 MED ORDER — ONDANSETRON HCL 4 MG/2ML IJ SOLN: 4.0000 mg | Freq: Four times a day (QID) | INTRAMUSCULAR | Status: DC | PRN

## 2023-01-04 MED ORDER — LIDOCAINE HCL 1 % IJ SOLN
INTRAMUSCULAR | Status: DC | PRN
  Administered 2023-01-04: 8 mL via SUBCUTANEOUS

## 2023-01-04 MED ORDER — SUGAMMADEX SODIUM 200 MG/2ML IV SOLN
INTRAVENOUS | Status: DC | PRN
  Administered 2023-01-04: 150 mg via INTRAVENOUS

## 2023-01-04 MED ORDER — OXYCODONE HCL 5 MG PO TABS
5.0000 mg | ORAL_TABLET | ORAL | Status: DC | PRN
  Administered 2023-01-05: 5 mg via ORAL
  Filled 2023-01-04: qty 1

## 2023-01-04 MED ORDER — ONDANSETRON HCL 4 MG/2ML IJ SOLN
INTRAMUSCULAR | Status: DC | PRN
  Administered 2023-01-04: 4 mg via INTRAVENOUS

## 2023-01-04 MED ORDER — INDOCYANINE GREEN 25 MG IV SOLR
INTRAVENOUS | Status: DC | PRN
  Administered 2023-01-04: 1.25 mg via INTRAVENOUS

## 2023-01-04 MED ORDER — ENSURE PRE-SURGERY PO LIQD: 296.0000 mL | Freq: Once | ORAL | Status: DC

## 2023-01-04 MED ORDER — METHOCARBAMOL 500 MG PO TABS: 500.0000 mg | ORAL_TABLET | Freq: Three times a day (TID) | ORAL | Status: DC | PRN

## 2023-01-04 MED ORDER — ORAL CARE MOUTH RINSE: 15.0000 mL | Freq: Once | OROMUCOSAL | Status: AC

## 2023-01-04 MED ORDER — 0.9 % SODIUM CHLORIDE (POUR BTL) OPTIME
TOPICAL | Status: DC | PRN
  Administered 2023-01-04: 1000 mL

## 2023-01-04 SURGICAL SUPPLY — 57 items
APPLIER CLIP 5 13 M/L LIGAMAX5 (MISCELLANEOUS) ×2
BAG COUNTER SPONGE SURGICOUNT (BAG) ×2 IMPLANT
BLADE CLIPPER SURG (BLADE) IMPLANT
CANISTER SUCT 3000ML PPV (MISCELLANEOUS) ×2 IMPLANT
CHLORAPREP W/TINT 26 (MISCELLANEOUS) ×2 IMPLANT
CLIP APPLIE 5 13 M/L LIGAMAX5 (MISCELLANEOUS) ×2 IMPLANT
CLSR STERI-STRIP ANTIMIC 1/2X4 (GAUZE/BANDAGES/DRESSINGS) IMPLANT
COVER SURGICAL LIGHT HANDLE (MISCELLANEOUS) ×2 IMPLANT
DERMABOND ADVANCED .7 DNX12 (GAUZE/BANDAGES/DRESSINGS) ×2 IMPLANT
DRAIN PENROSE 1/2X12 LTX STRL (WOUND CARE) IMPLANT
DRAPE LAPAROTOMY TRNSV 102X78 (DRAPES) ×2 IMPLANT
ELECT CAUTERY BLADE 6.4 (BLADE) ×2 IMPLANT
ELECT REM PT RETURN 9FT ADLT (ELECTROSURGICAL) ×2
ELECTRODE REM PT RTRN 9FT ADLT (ELECTROSURGICAL) ×2 IMPLANT
GAUZE 4X4 16PLY ~~LOC~~+RFID DBL (SPONGE) ×2 IMPLANT
GLOVE BIO SURGEON STRL SZ 6.5 (GLOVE) IMPLANT
GLOVE BIO SURGEON STRL SZ7 (GLOVE) ×2 IMPLANT
GLOVE BIOGEL PI IND STRL 7.0 (GLOVE) IMPLANT
GLOVE BIOGEL PI IND STRL 7.5 (GLOVE) ×2 IMPLANT
GLOVE SURG SS PI 7.0 STRL IVOR (GLOVE) IMPLANT
GOWN STRL REUS W/ TWL LRG LVL3 (GOWN DISPOSABLE) ×6 IMPLANT
GOWN STRL REUS W/TWL LRG LVL3 (GOWN DISPOSABLE) ×6
GRASPER SUT TROCAR 14GX15 (MISCELLANEOUS) ×2 IMPLANT
KIT BASIN OR (CUSTOM PROCEDURE TRAY) ×2 IMPLANT
KIT TURNOVER KIT B (KITS) ×2 IMPLANT
MARKER SKIN DUAL TIP RULER LAB (MISCELLANEOUS) ×2 IMPLANT
MESH ULTRAPRO 3X6 7.6X15CM (Mesh General) IMPLANT
NDL HYPO 25GX1X1/2 BEV (NEEDLE) ×2 IMPLANT
NEEDLE HYPO 25GX1X1/2 BEV (NEEDLE) ×2 IMPLANT
NS IRRIG 1000ML POUR BTL (IV SOLUTION) ×2 IMPLANT
PACK GENERAL/GYN (CUSTOM PROCEDURE TRAY) ×2 IMPLANT
PAD ARMBOARD 7.5X6 YLW CONV (MISCELLANEOUS) ×2 IMPLANT
PENCIL SMOKE EVACUATOR (MISCELLANEOUS) ×2 IMPLANT
POUCH RETRIEVAL ECOSAC 10 (ENDOMECHANICALS) ×2 IMPLANT
POUCH RETRIEVAL ECOSAC 10MM (ENDOMECHANICALS) ×2
SCISSORS LAP 5X35 DISP (ENDOMECHANICALS) ×2 IMPLANT
SET IRRIG TUBING LAPAROSCOPIC (IRRIGATION / IRRIGATOR) ×2 IMPLANT
SET TUBE SMOKE EVAC HIGH FLOW (TUBING) ×2 IMPLANT
SLEEVE ENDOPATH XCEL 5M (ENDOMECHANICALS) ×4 IMPLANT
SPECIMEN JAR SMALL (MISCELLANEOUS) ×2 IMPLANT
SPIKE FLUID TRANSFER (MISCELLANEOUS) IMPLANT
STRIP CLOSURE SKIN 1/2X4 (GAUZE/BANDAGES/DRESSINGS) ×2 IMPLANT
SUT MNCRL AB 4-0 PS2 18 (SUTURE) ×2 IMPLANT
SUT VIC AB 2-0 CT1 27 (SUTURE) ×2
SUT VIC AB 2-0 CT1 TAPERPNT 27 (SUTURE) ×2 IMPLANT
SUT VIC AB 2-0 SH 18 (SUTURE) ×2 IMPLANT
SUT VIC AB 3-0 SH 27 (SUTURE) ×2
SUT VIC AB 3-0 SH 27XBRD (SUTURE) ×2 IMPLANT
SUT VICRYL 0 UR6 27IN ABS (SUTURE) ×2 IMPLANT
SUT VICRYL AB 2 0 TIES (SUTURE) ×2 IMPLANT
SYR CONTROL 10ML LL (SYRINGE) ×2 IMPLANT
TOWEL GREEN STERILE (TOWEL DISPOSABLE) ×2 IMPLANT
TOWEL GREEN STERILE FF (TOWEL DISPOSABLE) ×2 IMPLANT
TRAY LAPAROSCOPIC MC (CUSTOM PROCEDURE TRAY) ×2 IMPLANT
TROCAR XCEL BLUNT TIP 100MML (ENDOMECHANICALS) ×2 IMPLANT
TROCAR Z-THREAD OPTICAL 5X100M (TROCAR) ×2 IMPLANT
WATER STERILE IRR 1000ML POUR (IV SOLUTION) ×2 IMPLANT

## 2023-01-04 NOTE — Anesthesia Postprocedure Evaluation (Signed)
Anesthesia Post Note  Patient: [name]  Procedure(s) Performed: RIGHT INGUINAL HERNIA REPAIR WITH MESH (Right) LAPAROSCOPIC CHOLECYSTECTOMY WITH ICG DYE     Patient location during evaluation: PACU Anesthesia Type: General Level of consciousness: awake Pain management: pain level controlled Vital Signs Assessment: post-procedure vital signs reviewed and stable Respiratory status: spontaneous breathing, nonlabored ventilation and respiratory function stable Cardiovascular status: blood pressure returned to baseline and stable Postop Assessment: no apparent nausea or vomiting Anesthetic complications: no   No notable events documented.  Last Vitals:  Vitals:   01/04/23 1155 01/04/23 1210  BP: (!) 159/86 (!) 154/68  Pulse: 81 83  Resp: 12 12  Temp:  37.2 C  SpO2: 100% 98%    Last Pain:  Vitals:   01/04/23 1210  TempSrc:   PainSc: 2                  Nilda Simmer

## 2023-01-04 NOTE — H&P (Signed)
89 year old male with a history of coronary artery disease and high cholesterol who had a coronary artery bypass 15 years ago. He has done well since then. He had some upper abdominal pain at some point earlier this year and underwent a right upper quadrant ultrasound in March. This showed a possible masslike area in his gallbladder wall near the fundus measuring 17 x 7 mm in size. His common bile duct was normal his liver was normal. He then eventually underwent an MRCP. This showed some mild intra and extrahepatic biliary ductal dilatation with the common bile duct measuring 8 mm. There was a 5 mm stone at the common bile duct near the ampulla. The gallbladder was mildly distended and contain tiny gallstones. There was no mass noted. There was a hyperenhancing mass of the liver with features most consistent with a small focal nodular hyperplasia which recommended follow-up in 6 months. He then underwent an ERCP that was uncomplicated with a sphincterotomy and cleared his choledocholithiasis. He has really no symptoms related to this now. He has no abdominal pain and is able to eat what ever he wants. He is referred for consideration of removal of his gallbladder to prevent other issues given the fact that he is already had choledocholithiasis. He does however have for about the last month right groin pain that he feels a mass present. This is always out. This is causing him some significant discomfort and is preventing him from doing activities. He has no nausea vomiting associated with this.  Review of Systems: A complete review of systems was obtained from the patient. I have reviewed this information and discussed as appropriate with the patient. See HPI as well for other ROS.  Review of Systems HENT: Positive for hearing loss. All other systems reviewed and are negative.   Medical History: Past Medical History: Diagnosis Date Arthritis GERD (gastroesophageal reflux  disease) Hyperlipidemia Hypertension  There is no problem list on file for this patient.  Past Surgical History: Procedure Laterality Date HEART BYPASS SURGERY 1996 X 5 PROSTATE SURGERY 2020 GALLSTONES 2023 BACK SURGERY   Allergies Allergen Reactions Pollen Extracts Cough  Current Outpatient Medications on File Prior to Visit Medication Sig Dispense Refill aspirin 325 MG EC tablet Take by mouth atenoloL (TENORMIN) 25 MG tablet Take 25 mg by mouth once daily ezetimibe (ZETIA) 10 mg tablet Take 10 mg by mouth once daily omeprazole (PRILOSEC) 40 MG DR capsule Take by mouth rosuvastatin (CRESTOR) 20 MG tablet Take by mouth  No current facility-administered medications on file prior to visit.  History reviewed. No pertinent family history.  Social History  Tobacco Use Smoking Status Never Smokeless Tobacco Never   Social History  Socioeconomic History Marital status: Married Tobacco Use Smoking status: Never Smokeless tobacco: Never Substance and Sexual Activity Alcohol use: Not Currently Drug use: Never  Objective:  Vitals: 12/27/22 1512 12/27/22 1518 BP: 124/60 Pulse: 74 Temp: 36.6 C (97.9 F) SpO2: 99% Weight: 67.6 kg (149 lb) Height: 177.8 cm ('5\' 10"'$ ) PainSc: 8 PainLoc: Groin  Body mass index is 21.38 kg/m.  Physical Exam Vitals reviewed. Constitutional: Appearance: Normal appearance. Eyes: General: No scleral icterus. Cardiovascular: Rate and Rhythm: Normal rate. Pulmonary: Effort: Pulmonary effort is normal. Abdominal: Palpations: Abdomen is soft. Tenderness: There is no abdominal tenderness. Hernia: A hernia is present. Hernia is present in the right inguinal area. There is no hernia in the left inguinal area. Neurological: Mental Status: He is alert.   \  Assessment and Plan:  Diagnoses and  all orders for this visit:  Gallstones  Non-recurrent unilateral inguinal hernia without obstruction or gangrene  Laparoscopic  cholecystectomy and open right inguinal hernia repair with mesh.  I think is reasonable to do both of these at the same time. I plan to do a lap chole and then just an open anterior right inguinal hernia repair. I think it is reasonable at his age to do this as he is otherwise very healthy and active and this is limiting him.  I discussed the procedure in detail. We discussed the risks and benefits of a laparoscopic cholecystectomy and possible cholangiogram including, but not limited to bleeding, infection, injury to surrounding structures such as the intestine or liver, bile leak, retained gallstones, need to convert to an open procedure, prolonged diarrhea, blood clots such as DVT, common bile duct injury, anesthesia risks, and possible need for additional procedures. The likelihood of improvement in symptoms and return to the patient's normal status is good. We discussed the typical post-operative recovery course.  We discussed observation versus repair. We discussed both laparoscopic and open inguinal hernia repairs. I described the procedure in detail. The patient was given educational material. Goals should be achieved with surgery. We discussed the usage of mesh and the rationale behind that. We went over the pathophysiology of inguinal hernia. We have elected to perform open inguinal hernia repair with mesh. We discussed the risks including bleeding, infection, recurrence, postoperative pain and chronic groin pain, testicular injury, urinary retention, numbness in groin and around incision.

## 2023-01-04 NOTE — Anesthesia Procedure Notes (Signed)
Procedure Name: Intubation Date/Time: 01/04/2023 8:44 AM  Performed by: Barrington Ellison, CRNAPre-anesthesia Checklist: Patient identified, Emergency Drugs available, Suction available and Patient being monitored Patient Re-evaluated:Patient Re-evaluated prior to induction Oxygen Delivery Method: Circle System Utilized Preoxygenation: Pre-oxygenation with 100% oxygen Induction Type: IV induction Ventilation: Mask ventilation without difficulty Laryngoscope Size: Mac and 4 Grade View: Grade I Tube type: Oral Tube size: 7.5 mm Number of attempts: 1 Airway Equipment and Method: Stylet and Oral airway Placement Confirmation: ETT inserted through vocal cords under direct vision, positive ETCO2 and breath sounds checked- equal and bilateral Secured at: 23 cm Tube secured with: Tape Dental Injury: Teeth and Oropharynx as per pre-operative assessment

## 2023-01-04 NOTE — Transfer of Care (Signed)
Immediate Anesthesia Transfer of Care Note  Patient: [name]  Procedure(s) Performed: RIGHT INGUINAL HERNIA REPAIR WITH MESH (Right) LAPAROSCOPIC CHOLECYSTECTOMY WITH ICG DYE  Patient Location: PACU  Anesthesia Type:General  Level of Consciousness: drowsy and confused  Airway & Oxygen Therapy: Patient Spontanous Breathing and Patient connected to nasal cannula oxygen  Post-op Assessment: Report given to RN  Post vital signs: Reviewed and stable  Last Vitals:  Vitals Value Taken Time  BP 162/122 01/04/23 1022  Temp    Pulse 70 01/04/23 1025  Resp 12 01/04/23 1025  SpO2 93 % 01/04/23 1025  Vitals shown include unvalidated device data.  Last Pain:  Vitals:   01/04/23 0713  TempSrc:   PainSc: 0-No pain         Complications: No notable events documented.

## 2023-01-04 NOTE — Op Note (Signed)
Preoperative diagnosis: RIH, history choledocholithiasis Postoperative diagnosis: indirect RIH, chronic cholecystitis Procedure:  Right inguinal hernia repair with Ultrapro mesh Laparoscopic cholecystectomy Surgeon: Dr Serita Grammes Anesthesia general with TAP block EBL; minimal Drains none Complications none Specimens gallbladder and contents to pathology Sponge and needle count correct Dispo to recovery stable.  Indications: 89 year old male with a history of coronary artery disease who had some upper abdominal pain at some point earlier this year and underwent a right upper quadrant ultrasound in March. This showed a possible masslike area in his gallbladder wall near the fundus measuring 17 x 7 mm in size. His common bile duct was normal his liver was normal. He then eventually underwent an MRCP. This showed some mild intra and extrahepatic biliary ductal dilatation with the common bile duct measuring 8 mm. There was a 5 mm stone at the common bile duct near the ampulla. The gallbladder was mildly distended and contain tiny gallstones. There was no mass noted. There was a hyperenhancing mass of the liver with features most consistent with a small focal nodular hyperplasia which recommended follow-up in 6 months. He then underwent an ERCP that was uncomplicated with a sphincterotomy and cleared his choledocholithiasis.we discussed laparoscopic cholecystectomy.  He also a very symptomatic RIH that we discussed repair also.   Procedure: After informed consent obtained patient was taken to the OR. He underwent a bilateral TAP block.  He was given antibiotics and SCDs were in place.  He was placed under general anesthesia without complication. He was prepped and draped in standard sterile surgical fashion.  Timeout was performed.   I infiltrated Marcaine and made a right groin incision.  I carried this through Scarpa's fascia and the external oblique.  This was taken through the external ring.  I  encircled the spermatic cord with a Penrose drain.  He had a weak floor but no direct hernia.  A very large cord lipoma that I excised.  He had a small indirect hernia sac which I dissected free and placed back into the abdomen.  I then sewed his internal ring with 2-0 Vicryl to tighten this up.  I then used an ultra Pro mesh patch and placed this over the entire floor.  I sutured this pubic tubercle with 2-0 Vicryl.  I sutured it to the inguinal ligament every half centimeter inferiorly.  I made a tea cut and wrapped this around the mesh.  I sutured this superiorly with Vicryl as well.  I then laid the lateral portion flat under the external oblique.  This completely obliterated the defect.  Hemostasis was observed.  I then closed the external oblique with 2-0 Vicryl.  Scarpa's was closed with 3-0 Vicryl skin was closed with 4-0 Monocryl and glue.  I then took his gallbladder out.  I made a vertical incision below his umbilicus.  I grasped the fascia and incised this sharply.  I entered his peritoneum bluntly without injury.  I placed a 0 Vicryl pursestring suture through the fascia.  I placed a Hassan trocar and insufflated the abdomen to 15 mmHg pressure.  I then placed 3 additional 5 mm trocars in the epigastrium and right upper quadrant without difficulty.  His gallbladder was noted to have chronic cholecystitis.  He had adhesions from his omentum that I took down bluntly.  I then retracted the gallbladder cephalad and lateral.  I then dissected in the triangle and clearly obtain the critical view of safety.  I used the ICG dye and the camera and  fluorescence to identify the common hepatic duct common bile duct as well as the cystic duct.  I was well away from the common duct.  I then clipped the cystic duct 3 times and divided it.  I left 2 clips in place.  The duct was viable and the clips completely traverse the duct.  I then clipped and divided a anterior posterior branch of the artery.  Then took the  gallbladder off the liver bed without spillage.  This was placed in a retrieval bag and removed.  Hemostasis was observed.  I then removed the [facility] trocar and time I pursestring down.  I used the suture passer to place another Vicryl suture to completely obliterate the defect.  The remaining trocars were removed and the abdomen was desufflated.  These were closed with 4-0 Monocryl and glue.  He tolerated this well was extubated transferred recovery stable.

## 2023-01-04 NOTE — Interval H&P Note (Signed)
History and Physical Interval Note:  01/04/2023 8:21 AM  [name]  has presented today for surgery, with the diagnosis of GALLSTONES, RIGHT INGUINAL HERNIA.  The various methods of treatment have been discussed with the patient and family. After consideration of risks, benefits and other options for treatment, the patient has consented to  Procedure(s) with comments: RIGHT INGUINAL HERNIA REPAIR WITH MESH (Right) - GEN & TAP BLOCK LAPAROSCOPIC CHOLECYSTECTOMY WITH ICG DYE (N/A) as a surgical intervention.  The patient's history has been reviewed, patient examined, no change in status, stable for surgery.  I have reviewed the patient's chart and labs.  Questions were answered to the patient's satisfaction.     Rolm Bookbinder

## 2023-01-04 NOTE — Plan of Care (Signed)

## 2023-01-04 NOTE — Discharge Instructions (Signed)
[facility] LAPAROSCOPIC SURGERY: POST OP INSTRUCTIONS  Always review your discharge instruction sheet given to you by the facility where your surgery was performed. IF YOU HAVE DISABILITY OR FAMILY LEAVE FORMS, YOU MUST BRING THEM TO THE OFFICE FOR PROCESSING.   DO NOT GIVE THEM TO YOUR DOCTOR.  A prescription for pain medication may be given to you upon discharge.  Take your pain medication as prescribed, if needed.  If narcotic pain medicine is not needed, then you may take acetaminophen (Tylenol), naprosyn (Alleve), or ibuprofen (Advil) as needed. Take your usually prescribed medications unless otherwise directed. If you need a refill on your pain medication, please contact your pharmacy.  They will contact our office to request authorization. Prescriptions will not be filled after 5pm or on week-ends. You should follow a light diet the first few days after arrival home, such as soup and crackers, etc.  Be sure to include lots of fluids daily. Most patients will experience some swelling and bruising in the area of the incisions.  Ice packs will help.  Swelling and bruising can take several days to resolve.  It is common to experience some constipation if taking pain medication after surgery.  Increasing fluid intake and taking a stool softener (such as Colace) will usually help or prevent this problem from occurring.  A mild laxative (Milk of Magnesia or Miralax) should be taken according to package instructions if there are no bowel movements after 48 hours. Unless discharge instructions indicate otherwise, you may remove your bandages 48 hours after surgery, and you may shower at that time.  You may have steri-strips (small skin tapes) in place directly over the incision.  These strips should be left on the skin for 7-10 days.  If your surgeon used skin glue on the incision, you may shower in 24 hours.  The glue will flake off over the next  2-3 weeks.  Any sutures or staples will be removed at the office during your follow-up visit. ACTIVITIES:  You may resume regular (light) daily activities beginning the next day--such as daily self-care, walking, climbing stairs--gradually increasing activities as tolerated.  You may have sexual intercourse when it is comfortable.  Refrain from any heavy lifting or straining until approved by your doctor. You may drive when you are no longer taking prescription pain medication, you can comfortably wear a seatbelt, and you can safely maneuver your car and apply brakes. RETURN TO WORK:  __________________________________________________________ You should see your doctor in the office for a follow-up appointment approximately 2-3 weeks after your surgery.  Make sure that you call for this appointment within a day or two after you arrive home to insure a convenient appointment time. OTHER INSTRUCTIONS: __________________________________________________________________________________________________________________________ __________________________________________________________________________________________________________________________ WHEN TO CALL YOUR DOCTOR: Fever over 101.0 Inability to urinate Continued bleeding from incision. Increased pain, redness, or drainage from the incision. Increasing abdominal pain  The clinic staff is available to answer your questions during regular business hours.  Please don't hesitate to call and ask to speak to one of the nurses for clinical concerns.  If you have a medical emergency, go to the nearest emergency room or call 911.  A surgeon from [facility] is always on call at the hospital. [address], [address], [place], [place]  [address] ? [address] [address], [place],    [address] [phone] ? [phone] ? FAX [phone] Web site: [email or web address]  

## 2023-01-04 NOTE — Anesthesia Procedure Notes (Addendum)
    Anesthesia Regional Block: TAP block   Pre-Anesthetic Checklist: , timeout performed,  Correct Patient, Correct Site, Correct Laterality,  Correct Procedure, Correct Position, site marked,  Risks and benefits discussed,  Surgical consent,  Pre-op evaluation,  At surgeon's request and post-op pain management  Laterality: Left and Right  Prep: chloraprep       Needles:  Injection technique: Single-shot  Needle Type: Echogenic Stimulator Needle     Needle Length: 9cm  Needle Gauge: 21     Additional Needles:   Procedures:,,,, ultrasound used (permanent image in chart),,    Narrative:  Start time: 01/04/2023 7:55 AM End time: 01/04/2023 8:02 AM Injection made incrementally with aspirations every 5 mL.  Performed by: Personally  Anesthesiologist: Nilda Simmer, MD  Additional Notes: Discussed risks and benefits of nerve block including, but not limited to, prolonged and/or permanent nerve injury involving sensory and/or motor function. Monitors were applied and a time-out was performed. The nerve and associated structures were visualized under ultrasound guidance. After negative aspiration, local anesthetic was slowly injected around the nerve. There was no evidence of high pressure during the procedure. There were no paresthesias. VSS remained stable and the patient tolerated the procedure well.

## 2023-01-05 DIAGNOSIS — K4090 Unilateral inguinal hernia, without obstruction or gangrene, not specified as recurrent: Secondary | ICD-10-CM | POA: Diagnosis not present

## 2023-01-05 LAB — SURGICAL PATHOLOGY

## 2023-01-05 NOTE — Discharge Summary (Signed)
Physician Discharge Summary  Patient ID: [name] MRN: [id number] DOB/AGE: 02-Jan-1934 89 y.o.  Admit date: 01/04/2023 Discharge date: 01/05/2023  Admission Diagnoses: [place] Cholelithiasis  Discharge Diagnoses:  Principal Problem:   S/P laparoscopic cholecystectomy   Discharged Condition: good  Hospital Course: 78 yom underwent RIH rrepair and lap chole. Remained overnight and is doing well following morning. He is ambulating, tol diet and voiding. Will dc home  Consults: None  Significant Diagnostic Studies: none  Treatments: surgery: RIH with mesh  Discharge Exam: Blood pressure (!) 155/63, pulse 68, temperature 97.7 F (36.5 C), temperature source Oral, resp. rate 17, height '5\' 10"'$  (1.778 m), weight 68 kg, SpO2 99 %. Ab soft approp tender right groin and abdominal incicisoin without infection/hematoma  Disposition: Discharge disposition: 01-Home or Self Care        Allergies as of 01/05/2023   No Known Allergies      Medication List     TAKE these medications    acetaminophen 500 MG tablet Commonly known as: TYLENOL Take 500-1,000 mg by mouth every 6 (six) hours as needed (pain).   aspirin EC 81 MG tablet Take 81 mg by mouth daily.   atenolol 25 MG tablet Commonly known as: TENORMIN Take 25 mg by mouth daily.   cetaphil cream Apply 1 Application topically daily.   ezetimibe 10 MG tablet Commonly known as: ZETIA Take 10 mg by mouth daily.   omeprazole 40 MG capsule Commonly known as: PRILOSEC Take 1 capsule (40 mg total) by mouth daily.   rosuvastatin 20 MG tablet Commonly known as: CRESTOR Take 20 mg by mouth every Sunday.        Follow-up Information     Rolm Bookbinder, MD Follow up in 3 week(s).   Specialty: General Surgery Contact information: [address] [place] [address] [phone]                 Signed: Rolm Bookbinder 01/05/2023, 8:35 AM

## 2023-01-06 ENCOUNTER — Encounter (HOSPITAL_COMMUNITY): Payer: Self-pay | Admitting: General Surgery

## 2023-06-12 ENCOUNTER — Encounter (INDEPENDENT_AMBULATORY_CARE_PROVIDER_SITE_OTHER): Payer: Self-pay | Admitting: Gastroenterology

## 2023-06-12 ENCOUNTER — Ambulatory Visit (INDEPENDENT_AMBULATORY_CARE_PROVIDER_SITE_OTHER): Payer: Medicare HMO | Admitting: Gastroenterology

## 2023-06-12 VITALS — BP 179/89 | HR 65 | Temp 97.5°F | Ht 70.0 in | Wt 146.9 lb

## 2023-06-12 DIAGNOSIS — K5904 Chronic idiopathic constipation: Secondary | ICD-10-CM

## 2023-06-12 DIAGNOSIS — K219 Gastro-esophageal reflux disease without esophagitis: Secondary | ICD-10-CM | POA: Diagnosis not present

## 2023-06-12 DIAGNOSIS — R1319 Other dysphagia: Secondary | ICD-10-CM

## 2023-06-12 DIAGNOSIS — K5900 Constipation, unspecified: Secondary | ICD-10-CM | POA: Insufficient documentation

## 2023-06-12 NOTE — Progress Notes (Signed)
[name], M.D. Gastroenterology & Hepatology [facility] [address] [place], [place] [address]  Primary Care Physician: Roy Perches, MD [address] [place] [address]  I will communicate my assessment and recommendations to the referring MD via EMR.  Problems: Change in bowel movements History of choledocholithiasis  History of Present Illness: [name] is a 89 y.o. male with past medical history of coronary artery disease, fecal incontinence, myocardial infarction and coronary artery disease status post CABG , who presents for follow up of f changes in bowel movements, choledocholithiasis and dysphagia.  The patient was last seen on 12/05/2022. At that time, the patient was continued on omeprazole 40 mg every day for management of GERD.  He was advised to follow-up with general surgery as scheduled for cholecystectomy.  The patient underwent cholecystectomy with Dr. Emelia Loron at [facility] surgery on 01/04/2023.  He also had a repair of right inguinal hernia.  Patient reports that he thinks some of his medications can cause some constipation. Sometimes he needs to strain to move his bowel movements but this is not frequent. He takes a stool softener on a daily basis which he believes helps with the bowel movements.  He is taking Protonix 40 mg qday, which controls his heartburn. No odynophagia. Very rarely he feels that food takes more time to go down but does not happen very frequently. Happens with both solids and liquids.  The patient denies having any nausea, vomiting, fever, chills, hematochezia, melena, hematemesis, abdominal distention, abdominal pain, diarrhea, jaundice, pruritus or weight loss.  Last Endoscopy:feb 2022 No endoscopic esophageal abnormality to explain patient's dysphagia. Esophagus dilated. Evidence of mucosal disruption - possible muscle bar in proximal esophagus. - 2 cm  hiatal hernia. - A few gastric polyps. (Fundic gland polyps) - Normal examined duodenum.  Past Medical History: Past Medical History:  Diagnosis Date   Barrett's esophagus 01/25/2017   Chronic kidney disease    stage 3   Coronary artery disease    Diabetes mellitus without complication (HCC)    type 2, no meds, patient does  not know about this dx but on Dr Alonza Smoker record   Dysphagia 01/25/2017   hx of   Fecal incontinence    small amount leakage   Foley catheter in place    changed Dec 31 2018   GERD (gastroesophageal reflux disease)    Hyperlipidemia    Myocardial infarction [facility]) 1996or 1997   Pneumonia    Urinary retention     Past Surgical History: Past Surgical History:  Procedure Laterality Date   BACK SURGERY     lower   BIOPSY  02/16/2021   Procedure: BIOPSY;  Surgeon: Dolores Frame, MD;  Location: [facility];  Service: Gastroenterology;;   BIOPSY  11/10/2022   Procedure: BIOPSY;  Surgeon: Lemar Lofty., MD;  Location: [facility];  Service: Gastroenterology;;   cataract surgery     bilateral   CHOLECYSTECTOMY N/A 01/04/2023   Procedure: LAPAROSCOPIC CHOLECYSTECTOMY WITH ICG DYE;  Surgeon: Emelia Loron, MD;  Location: [facility] OR;  Service: General;  Laterality: N/A;   CORONARY ARTERY BYPASS GRAFT  1996   15 yrs ago x 5    ENDOSCOPIC RETROGRADE CHOLANGIOPANCREATOGRAPHY (ERCP) WITH PROPOFOL N/A 11/10/2022   Procedure: ENDOSCOPIC RETROGRADE CHOLANGIOPANCREATOGRAPHY (ERCP) WITH PROPOFOL;  Surgeon: Lemar Lofty., MD;  Location: [facility];  Service: Gastroenterology;  Laterality: N/A;   ESOPHAGEAL DILATION N/A 02/17/2017   Procedure: ESOPHAGEAL DILATION;  Surgeon:  Malissa Hippo, MD;  [facility];  Service: Endoscopy;  Laterality: N/A;   ESOPHAGEAL DILATION N/A 02/16/2021   Procedure: ESOPHAGEAL DILATION;  Surgeon: Dolores Frame, MD;  [facility];  Service: Gastroenterology;  Laterality: N/A;    ESOPHAGOGASTRODUODENOSCOPY N/A 02/17/2017   Procedure: ESOPHAGOGASTRODUODENOSCOPY (EGD);  Surgeon: Malissa Hippo, MD;  [facility];  Service: Endoscopy;  Laterality: N/A;  12:45   ESOPHAGOGASTRODUODENOSCOPY (EGD) WITH PROPOFOL N/A 02/16/2021   Procedure: ESOPHAGOGASTRODUODENOSCOPY (EGD) WITH PROPOFOL;  Surgeon: Dolores Frame, MD;  [facility];  Service: Gastroenterology;  Laterality: N/A;  9:15   INGUINAL HERNIA REPAIR Right 01/04/2023   Procedure: RIGHT INGUINAL HERNIA REPAIR WITH MESH;  Surgeon: Emelia Loron, MD;  Location: [facility] OR;  Service: General;  Laterality: Right;  GEN & TAP BLOCK   PANCREATIC STENT PLACEMENT  11/10/2022   Procedure: PANCREATIC STENT PLACEMENT;  Surgeon: Lemar Lofty., MD;  Location: [facility];  Service: Gastroenterology;;   POLYPECTOMY  02/16/2021   Procedure: POLYPECTOMY;  Surgeon: Dolores Frame, MD;  [facility];  Service: Gastroenterology;;   REMOVAL OF STONES  11/10/2022   Procedure: REMOVAL OF STONES;  Surgeon: Lemar Lofty., MD;  Location: [facility];  Service: Gastroenterology;;   ROTATOR CUFF REPAIR     Left   SPHINCTEROTOMY  11/10/2022   Procedure: Dennison Mascot;  Surgeon: Lemar Lofty., MD;  Location: [facility];  Service: Gastroenterology;;   TRANSURETHRAL RESECTION OF PROSTATE N/A 02/06/2019   Procedure: TRANSURETHRAL RESECTION OF THE PROSTATE (TURP);  Surgeon: Crist Fat, MD;  Location: [facility];  Service: Urology;  Laterality: N/A;    Family History:History reviewed. No pertinent family history.  Social History: Social History   Tobacco Use  Smoking Status Former   Passive exposure: Never  Smokeless Tobacco Former   Quit date: 1995  Tobacco Comments   quit more than 20 years ago   Social History   Substance and Sexual Activity  Alcohol Use No   Social History   Substance and Sexual Activity  Drug Use No    Allergies: No  Known Allergies  Medications: Current Outpatient Medications  Medication Sig Dispense Refill   acetaminophen (TYLENOL) 500 MG tablet Take 500-1,000 mg by mouth every 6 (six) hours as needed (pain).     aspirin EC 81 MG tablet Take 81 mg by mouth daily.     atenolol (TENORMIN) 25 MG tablet Take 25 mg by mouth daily.     Emollient (CETAPHIL) cream Apply 1 Application topically daily.     ezetimibe (ZETIA) 10 MG tablet Take 10 mg by mouth daily.     omeprazole (PRILOSEC) 40 MG capsule Take 1 capsule (40 mg total) by mouth daily. 30 capsule 12   rosuvastatin (CRESTOR) 20 MG tablet Take 20 mg by mouth every Sunday.     No current facility-administered medications for this visit.   Facility-Administered Medications Ordered in Other Visits  Medication Dose Route Frequency Provider Last Rate Last Admin   Spy Agent Green / Firefly Optime  1.25 mg Intravenous Once Emelia Loron, MD        Review of Systems: GENERAL: negative for malaise, night sweats HEENT: No changes in hearing or vision, no nose bleeds or other nasal problems. NECK: Negative for lumps, goiter, pain and significant neck swelling RESPIRATORY: Negative for cough, wheezing CARDIOVASCULAR: Negative for chest pain, leg swelling, palpitations, orthopnea GI: SEE HPI MUSCULOSKELETAL: Negative for joint pain or swelling, back pain, and  muscle pain. SKIN: Negative for lesions, rash PSYCH: Negative for sleep disturbance, mood disorder and recent psychosocial stressors. HEMATOLOGY Negative for prolonged bleeding, bruising easily, and swollen nodes. ENDOCRINE: Negative for cold or heat intolerance, polyuria, polydipsia and goiter. NEURO: negative for tremor, gait imbalance, syncope and seizures. The remainder of the review of systems is noncontributory.   Physical Exam: BP (!) 179/89 (BP Location: Left Arm, Patient Position: Sitting, Cuff Size: Normal)   Pulse 65   Temp (!) 97.5 F (36.4 C) (Temporal)   Ht 5\' 10"  (1.778 m)    Wt 146 lb 14.4 oz (66.6 kg)   BMI 21.08 kg/m  GENERAL: The patient is AO x3, in no acute distress. HEENT: Head is normocephalic and atraumatic. EOMI are intact. Mouth is well hydrated and without lesions. NECK: Supple. No masses LUNGS: Clear to auscultation. No presence of rhonchi/wheezing/rales. Adequate chest expansion HEART: RRR, normal s1 and s2. ABDOMEN: Soft, nontender, no guarding, no peritoneal signs, and nondistended. BS +. No masses. EXTREMITIES: Without any cyanosis, clubbing, rash, lesions or edema. NEUROLOGIC: AOx3, no focal motor deficit. SKIN: no jaundice, no rashes  Imaging/Labs: as above  I personally reviewed and interpreted the available labs, imaging and endoscopic files.  Impression and Plan: [name] is a 89 y.o. male with past medical history of coronary artery disease, fecal incontinence, myocardial infarction and coronary artery disease status post CABG , who presents for follow up of f changes in bowel movements, choledocholithiasis and dysphagia.  Patient has presented chronic history of dysphagia which has somewhat improved after his last EGD.  He has very rare episodes of significant dysphagia.  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  For now, he would like to continue with dietary modifications to avoid any further episodes of dysphagia.  In terms of his GERD, this has been well-controlled with the use of pantoprazole on a daily basis.  Will continue at the same dosage for now.  Finally, he has presented some intermittent changes in his bowel movement consistency.  Fecal smearing has resolved but now is presenting some occasional constipation.  I advised him to continue with stool softeners on a regular basis, but may need to start MiraLAX regularly if this does not provide complete relief of the constipation.  - Continue stool softeners daily - Continue pantoprazole 40 mg qday -Dietary dysphagia modifications  All questions were  answered.      Roy Blazing, MD Gastroenterology and Hepatology [facility]

## 2023-06-12 NOTE — Patient Instructions (Addendum)
Continue stool softeners daily Continue pantoprazole 40 mg qday Cut food in small pieces and chew food thoroughly to avoid issues when swallowing.

## 2023-09-28 ENCOUNTER — Other Ambulatory Visit (HOSPITAL_COMMUNITY): Payer: Self-pay | Admitting: Internal Medicine

## 2023-09-28 DIAGNOSIS — R7989 Other specified abnormal findings of blood chemistry: Secondary | ICD-10-CM

## 2023-10-05 ENCOUNTER — Ambulatory Visit (HOSPITAL_COMMUNITY)
Admission: RE | Admit: 2023-10-05 | Discharge: 2023-10-05 | Disposition: A | Discharge status: Home / Self Care | Payer: Medicare HMO | Source: Ambulatory Visit | Attending: Internal Medicine | Admitting: Internal Medicine

## 2023-10-05 DIAGNOSIS — R7989 Other specified abnormal findings of blood chemistry: Secondary | ICD-10-CM | POA: Diagnosis present

## 2023-11-11 IMAGING — US US ABDOMEN COMPLETE
1 series · 13 of 25 positions shown · non-contrast
Comparison: Aortic ultrasound 12/04/2015

CLINICAL DATA: Ectatic aorta, hydronephrosis, coronary artery
disease post MI, hyperlipidemia, former smoker

EXAM:
ABDOMEN ULTRASOUND COMPLETE

[Series 1: us abdomen complete · 13 of 151 slices shown]
[im 1/151]
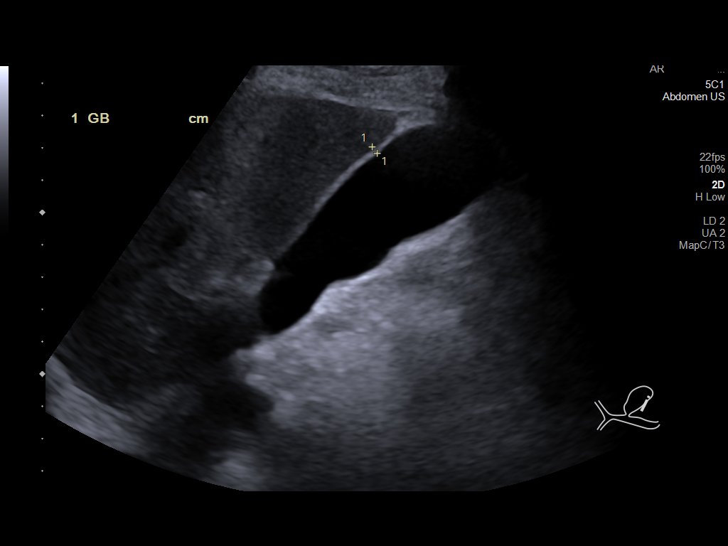
[im 13/151]
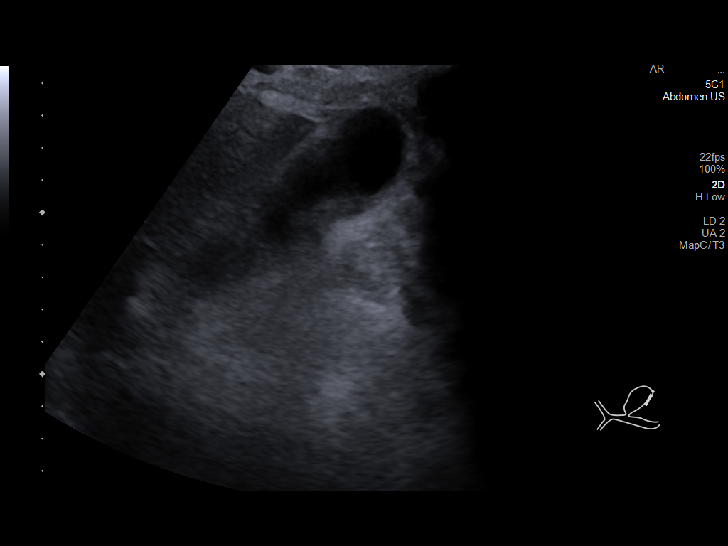
[im 26/151]
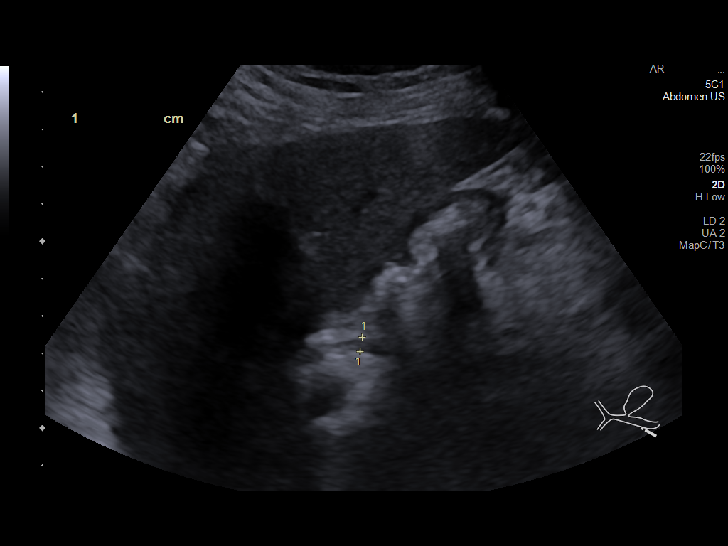
[im 38/151]
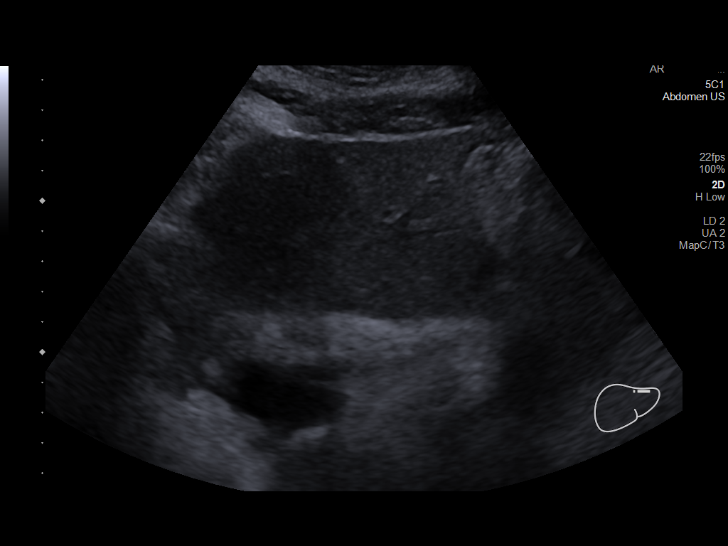
[im 51/151]
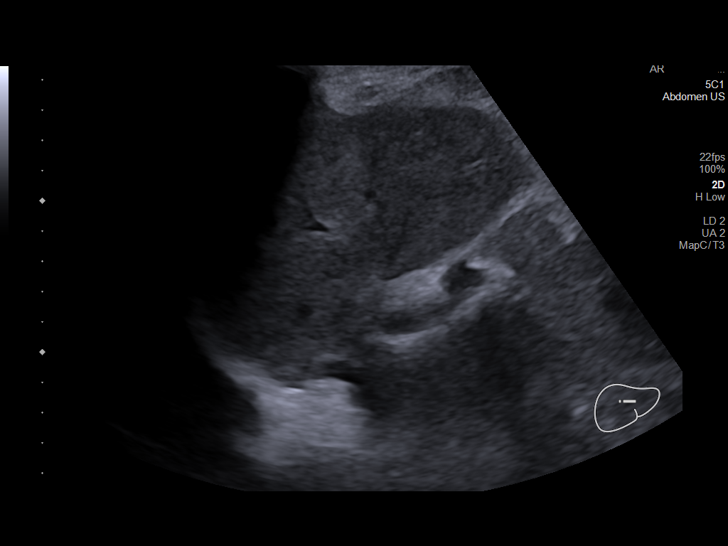
[im 63/151]
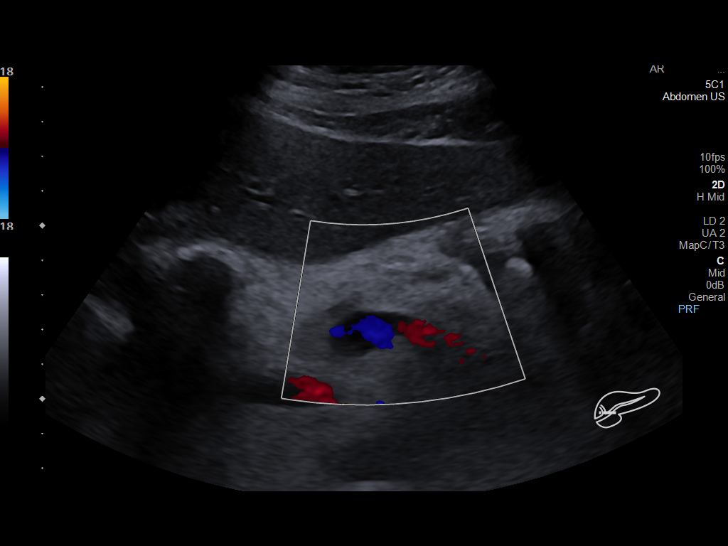
[im 76/151]
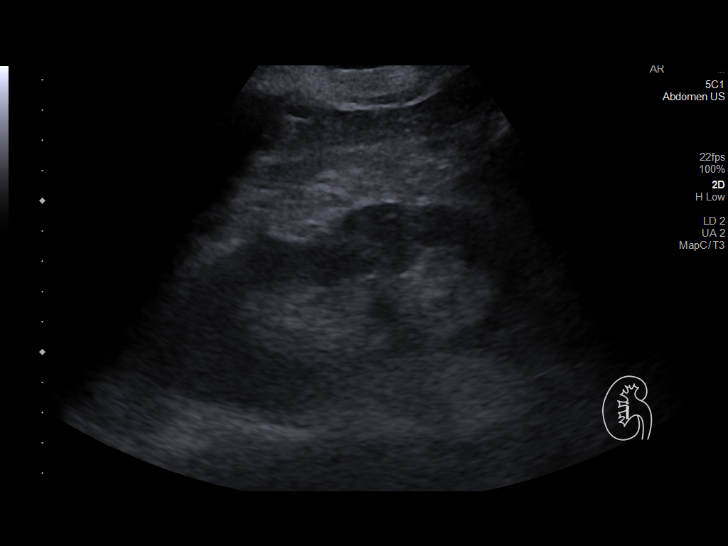
[im 88/151]
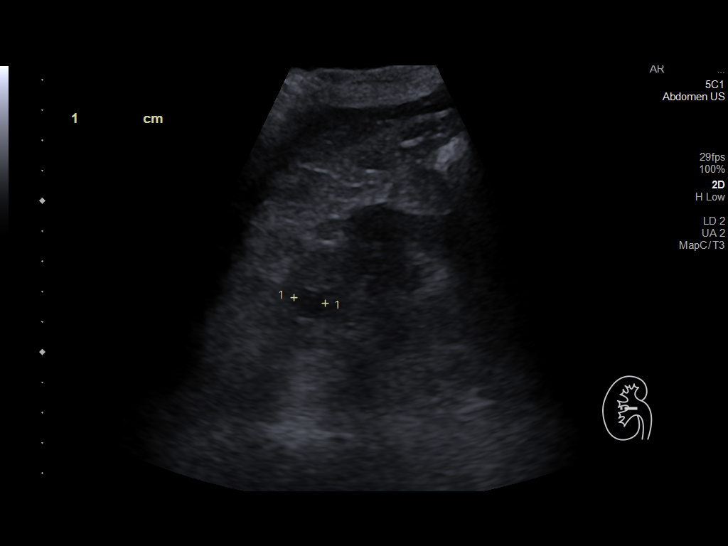
[im 101/151]
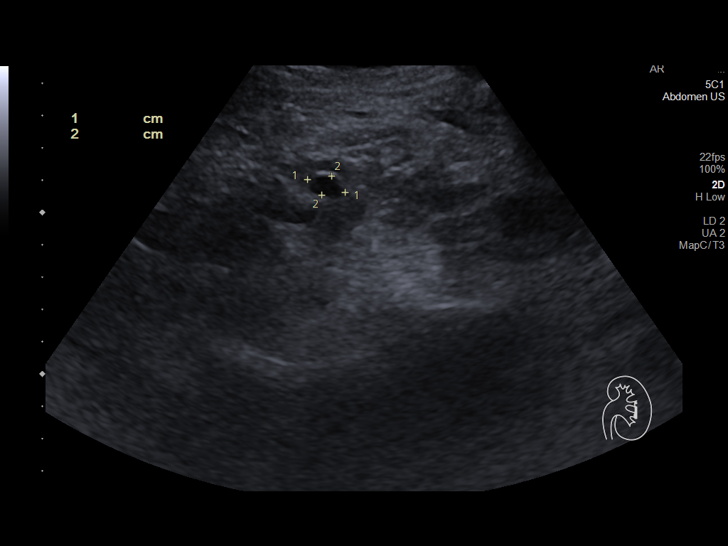
[im 113/151]
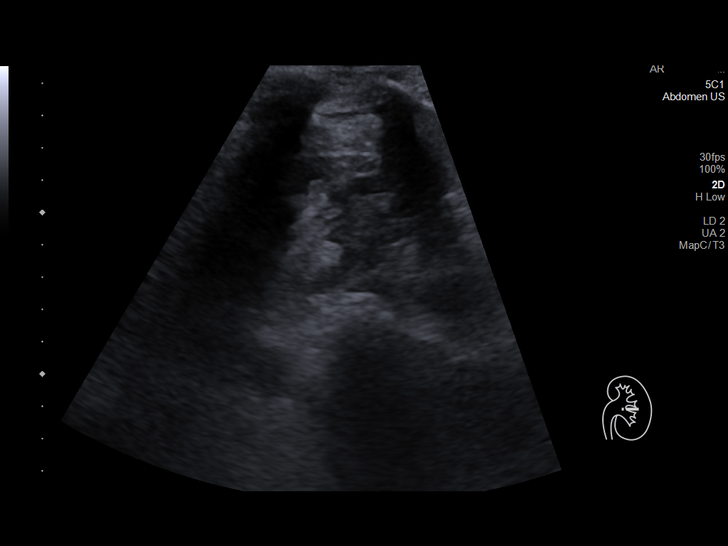
[im 126/151]
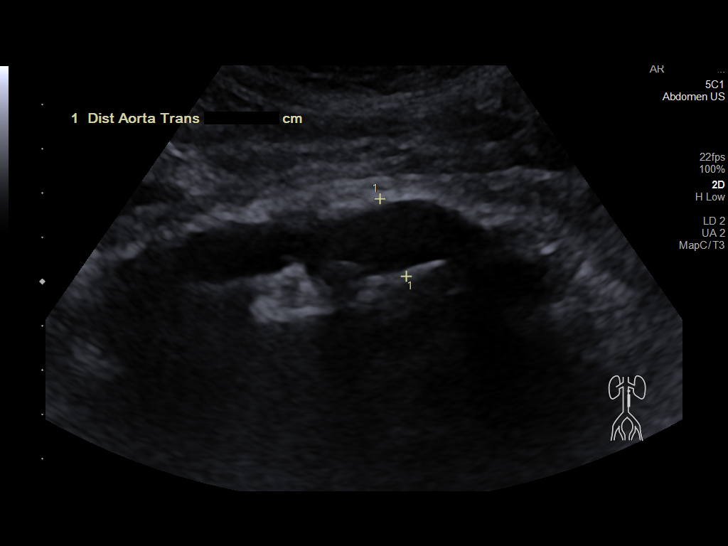
[im 138/151]
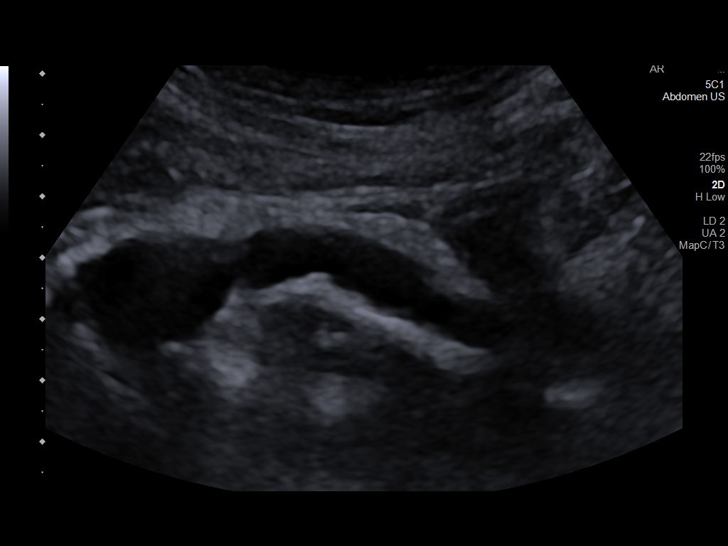
[im 151/151]
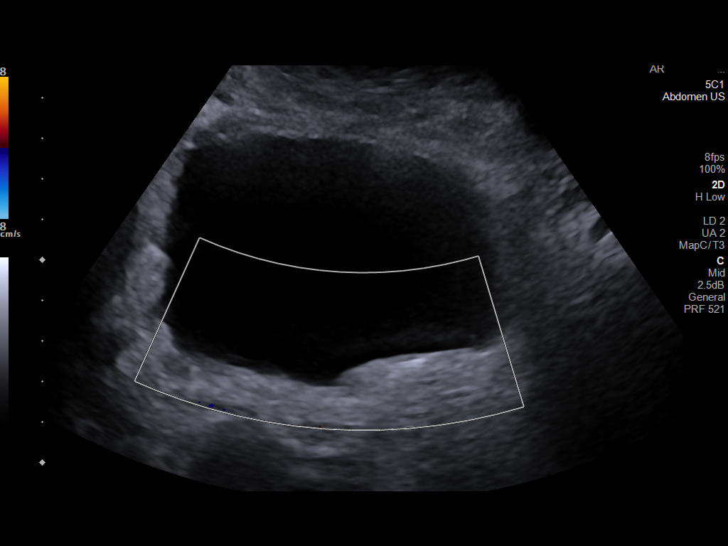

[13 of 25 positions shown; findings below may reference images not displayed]

FINDINGS: Gallbladder: Normally distended without definite shadowing calculi
or mobile sludge. Masslike area at gallbladder wall near fundus 17 x
7 mm in size, concerning for gallbladder mass/neoplasm. No definite
blood flow seen within this lesion on color Doppler imaging. No
additional wall thickening or pericholecystic fluid. No sonographic
Murphy sign.

Common bile duct: Diameter: 4 mm, normal

Liver: Normal echogenicity without mass or nodularity. No
intrahepatic biliary dilatation. Portal vein is patent on color
Doppler imaging with normal direction of blood flow towards the
liver.

IVC: Normal appearance

Pancreas: Normal appearance

Spleen: Normal appearance, 5.7 cm length

Right Kidney: Length: 12.5 cm. Normal cortical thickness and
echogenicity with mildly lobulated contour noted. Questionable tiny
cyst mid kidney 11 mm diameter. No additional mass or
hydronephrosis.

Left Kidney: Length: 11.7 cm. Mild cortical thinning with normal
cortical echogenicity. Exophytic cyst at inferior pole 3.8 x 2.9 x
3.2 cm containing a single thin partial septation. Additional tiny
exophytic cyst 12 mm diameter laterally. No solid mass or
hydronephrosis.

Abdominal aorta: Normal caliber

Other findings: No free fluid. Incidentally noted is presence of
aneurysmal dilatation of the common iliac arteries, 18 x 17 mm RIGHT
and minimally on LEFT 17 x 15 mm.
IMPRESSION: Small BILATERAL renal cysts, with a minimally complicated cyst at
inferior pole LEFT kidney 3.8 cm diameter containing a single thin
partial septation.

Masslike thickening of the gallbladder wall at fundus, concerning
for gallbladder mass/neoplasm; surgical evaluation recommended. If
surgery not performed, this could potentially be assessed by MR or
CT with and without contrast.

Chronic aneurysmal dilatation of the common iliac arteries
bilaterally.

## 2024-01-30 DIAGNOSIS — H04123 Dry eye syndrome of bilateral lacrimal glands: Secondary | ICD-10-CM | POA: Diagnosis not present

## 2024-03-08 DIAGNOSIS — E119 Type 2 diabetes mellitus without complications: Secondary | ICD-10-CM | POA: Diagnosis not present

## 2024-03-08 DIAGNOSIS — H524 Presbyopia: Secondary | ICD-10-CM | POA: Diagnosis not present

## 2024-03-08 DIAGNOSIS — Z961 Presence of intraocular lens: Secondary | ICD-10-CM | POA: Diagnosis not present

## 2024-03-12 DIAGNOSIS — Z79899 Other long term (current) drug therapy: Secondary | ICD-10-CM | POA: Diagnosis not present

## 2024-03-12 DIAGNOSIS — I2510 Atherosclerotic heart disease of native coronary artery without angina pectoris: Secondary | ICD-10-CM | POA: Diagnosis not present

## 2024-03-12 DIAGNOSIS — E785 Hyperlipidemia, unspecified: Secondary | ICD-10-CM | POA: Diagnosis not present

## 2024-03-12 DIAGNOSIS — R945 Abnormal results of liver function studies: Secondary | ICD-10-CM | POA: Diagnosis not present

## 2024-03-12 DIAGNOSIS — E875 Hyperkalemia: Secondary | ICD-10-CM | POA: Diagnosis not present

## 2024-03-19 DIAGNOSIS — E785 Hyperlipidemia, unspecified: Secondary | ICD-10-CM | POA: Diagnosis not present

## 2024-03-19 DIAGNOSIS — E875 Hyperkalemia: Secondary | ICD-10-CM | POA: Diagnosis not present

## 2024-03-19 DIAGNOSIS — R7401 Elevation of levels of liver transaminase levels: Secondary | ICD-10-CM | POA: Diagnosis not present

## 2024-03-20 DIAGNOSIS — L409 Psoriasis, unspecified: Secondary | ICD-10-CM | POA: Diagnosis not present

## 2024-03-20 DIAGNOSIS — I2510 Atherosclerotic heart disease of native coronary artery without angina pectoris: Secondary | ICD-10-CM | POA: Diagnosis not present

## 2024-03-20 DIAGNOSIS — K219 Gastro-esophageal reflux disease without esophagitis: Secondary | ICD-10-CM | POA: Diagnosis not present

## 2024-03-20 DIAGNOSIS — Z87891 Personal history of nicotine dependence: Secondary | ICD-10-CM | POA: Diagnosis not present

## 2024-03-20 DIAGNOSIS — E785 Hyperlipidemia, unspecified: Secondary | ICD-10-CM | POA: Diagnosis not present

## 2024-03-20 DIAGNOSIS — Z7982 Long term (current) use of aspirin: Secondary | ICD-10-CM | POA: Diagnosis not present

## 2024-06-10 ENCOUNTER — Encounter (INDEPENDENT_AMBULATORY_CARE_PROVIDER_SITE_OTHER): Payer: Self-pay | Admitting: Gastroenterology

## 2024-06-10 ENCOUNTER — Ambulatory Visit (INDEPENDENT_AMBULATORY_CARE_PROVIDER_SITE_OTHER): Payer: Medicare HMO | Admitting: Gastroenterology

## 2024-06-10 VITALS — BP 174/94 | HR 55 | Temp 97.5°F | Ht 70.0 in | Wt 147.5 lb

## 2024-06-10 DIAGNOSIS — R1319 Other dysphagia: Secondary | ICD-10-CM

## 2024-06-10 DIAGNOSIS — R1310 Dysphagia, unspecified: Secondary | ICD-10-CM | POA: Diagnosis not present

## 2024-06-10 DIAGNOSIS — R194 Change in bowel habit: Secondary | ICD-10-CM | POA: Diagnosis not present

## 2024-06-10 DIAGNOSIS — K219 Gastro-esophageal reflux disease without esophagitis: Secondary | ICD-10-CM

## 2024-06-10 NOTE — Patient Instructions (Signed)
 Continue pantoprazole  40 mg every day Cut food in small pieces and chew food thoroughly to avoid choking episodes.  If you present worsening issues swallowing, please let us  know to schedule an EGD.

## 2024-06-10 NOTE — Progress Notes (Unsigned)
 [name], M.D. Gastroenterology & Hepatology [facility] Gastroenterology [address] [place], [place] [address]  Primary Care Physician: Artemisa Bile, MD [address] [place] [address]  I will communicate my assessment and recommendations to the referring MD via EMR.  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 male with past medical history of coronary artery disease, fecal incontinence, myocardial infarction and coronary artery disease status post CABG , who presents for follow up of f changes in bowel movements, choledocholithiasis and dysphagia.  The patient was last seen on 06/12/23. At that time, the patient was advised to continue stool softeners daily and pantoprazole  40 mg a day, as well as dietary modifications for dysphagia.  Patient reports that sometimes food takes longer to go down than usual but not having vomiting or regurgitation. Feels he has to drink a little more than usual to swallow sometimes. No heartburn or odynophagia. Takes omeprazole  40 mg qday.  Stopped using stool softeners as he is having regular Bms, sometimes is hard to completely clean.  The patient denies having any nausea, vomiting, fever, chills, hematochezia, melena, hematemesis, abdominal distention, abdominal pain, diarrhea, jaundice, pruritus or weight loss.  Last Endoscopy:feb 2022 No endoscopic esophageal abnormality to explain patient's dysphagia. Esophagus dilated. Evidence of mucosal disruption - possible muscle bar in proximal esophagus. - 2 cm hiatal hernia. - A few gastric polyps. (Fundic gland polyps) - Normal examined duodenum.  Past Medical History: Past Medical History:  Diagnosis Date   Barrett's esophagus 01/25/2017   Chronic kidney disease    stage 3   Coronary artery disease    Diabetes mellitus without  complication (HCC)    type 2, no meds, patient does  not know about this dx but on Dr Velna Ghee record   Dysphagia 01/25/2017   hx of   Fecal incontinence    small amount leakage   Foley catheter in place    changed Dec 31 2018   GERD (gastroesophageal reflux disease)    Hyperlipidemia    Myocardial infarction [facility]) 1996or 1997   Pneumonia    Urinary retention     Past Surgical History: Past Surgical History:  Procedure Laterality Date   BACK SURGERY     lower   BIOPSY  02/16/2021   Procedure: BIOPSY;  Surgeon: Urban Garden, MD;  [facility];  Service: Gastroenterology;;   BIOPSY  11/10/2022   Procedure: BIOPSY;  Surgeon: Normie Becton., MD;  Location: [facility];  Service: Gastroenterology;;   cataract surgery     bilateral   CHOLECYSTECTOMY N/A 01/04/2023   Procedure: LAPAROSCOPIC CHOLECYSTECTOMY WITH ICG DYE;  Surgeon: Enid Harry, MD;  [facility];  Service: General;  Laterality: N/A;   CORONARY ARTERY BYPASS GRAFT  1996   15 yrs ago x 5    ENDOSCOPIC RETROGRADE CHOLANGIOPANCREATOGRAPHY (ERCP) WITH PROPOFOL  N/A 11/10/2022   Procedure: ENDOSCOPIC RETROGRADE CHOLANGIOPANCREATOGRAPHY (ERCP) WITH PROPOFOL ;  Surgeon: Normie Becton., MD;  Location: [facility];  Service: Gastroenterology;  Laterality: N/A;   ESOPHAGEAL DILATION N/A 02/17/2017   Procedure: ESOPHAGEAL DILATION;  Surgeon: Ruby Corporal, MD;  [facility];  Service: Endoscopy;  Laterality: N/A;   ESOPHAGEAL DILATION N/A 02/16/2021   Procedure: ESOPHAGEAL DILATION;  Surgeon: Urban Garden, MD;  [facility];  Service: Gastroenterology;  Laterality: N/A;   ESOPHAGOGASTRODUODENOSCOPY N/A 02/17/2017   Procedure:  ESOPHAGOGASTRODUODENOSCOPY (EGD);  Surgeon: Ruby Corporal, MD;  [facility];  Service: Endoscopy;  Laterality: N/A;  12:45   ESOPHAGOGASTRODUODENOSCOPY (EGD) WITH PROPOFOL  N/A 02/16/2021   Procedure:  ESOPHAGOGASTRODUODENOSCOPY (EGD) WITH PROPOFOL ;  Surgeon: Urban Garden, MD;  [facility];  Service: Gastroenterology;  Laterality: N/A;  9:15   INGUINAL HERNIA REPAIR Right 01/04/2023   Procedure: RIGHT INGUINAL HERNIA REPAIR WITH MESH;  Surgeon: Enid Harry, MD;  Location: [facility] OR;  Service: General;  Laterality: Right;  GEN & TAP BLOCK   PANCREATIC STENT PLACEMENT  11/10/2022   Procedure: PANCREATIC STENT PLACEMENT;  Surgeon: Normie Becton., MD;  Location: [facility];  Service: Gastroenterology;;   POLYPECTOMY  02/16/2021   Procedure: POLYPECTOMY;  Surgeon: Urban Garden, MD;  [facility];  Service: Gastroenterology;;   REMOVAL OF STONES  11/10/2022   Procedure: REMOVAL OF STONES;  Surgeon: Normie Becton., MD;  Location: [facility];  Service: Gastroenterology;;   ROTATOR CUFF REPAIR     Left   SPHINCTEROTOMY  11/10/2022   Procedure: [address];  Surgeon: Normie Becton., MD;  Location: [facility];  Service: Gastroenterology;;   TRANSURETHRAL RESECTION OF PROSTATE N/A 02/06/2019   Procedure: TRANSURETHRAL RESECTION OF THE PROSTATE (TURP);  Surgeon: Andrez Banker, MD;  [facility];  Service: Urology;  Laterality: N/A;    Family History:History reviewed. No pertinent family history.  Social History: Social History   Tobacco Use  Smoking Status Former   Passive exposure: Never  Smokeless Tobacco Former   Quit date: 1995  Tobacco Comments   quit more than 20 years ago   Social History   Substance and Sexual Activity  Alcohol Use No   Social History   Substance and Sexual Activity  Drug Use No    Allergies: No Known Allergies  Medications: Current Outpatient Medications  Medication Sig Dispense Refill   acetaminophen  (TYLENOL ) 500 MG tablet Take 500-1,000 mg by mouth every 6 (six) hours as needed (pain).     aspirin  EC 81 MG tablet Take 81 mg by mouth daily.     atenolol   (TENORMIN ) 25 MG tablet Take 25 mg by mouth daily.     Emollient (CETAPHIL) cream Apply 1 Application topically daily.     ezetimibe  (ZETIA ) 10 MG tablet Take 10 mg by mouth daily.     omeprazole  (PRILOSEC) 40 MG capsule Take 1 capsule (40 mg total) by mouth daily. 30 capsule 12   rosuvastatin  (CRESTOR ) 20 MG tablet Take 20 mg by mouth every Sunday.     sodium zirconium cyclosilicate (LOKELMA) 10 g PACK packet Take 10 g by mouth 3 (three) times a week.     No current facility-administered medications for this visit.   Facility-Administered Medications Ordered in Other Visits  Medication Dose Route Frequency Provider Last Rate Last Admin   Spy Agent Green / Firefly Optime  1.25 mg Intravenous Once Enid Harry, MD        Review of Systems: GENERAL: negative for malaise, night sweats HEENT: No changes in hearing or vision, no nose bleeds or other nasal problems. NECK: Negative for lumps, goiter, pain and significant neck swelling RESPIRATORY: Negative for cough, wheezing CARDIOVASCULAR: Negative for chest pain, leg swelling, palpitations, orthopnea GI: SEE HPI MUSCULOSKELETAL: Negative for joint pain or swelling, back pain, and muscle pain. SKIN: Negative for lesions, rash PSYCH: Negative for sleep disturbance, mood disorder and recent psychosocial stressors. HEMATOLOGY Negative for prolonged bleeding, bruising easily, and swollen nodes. ENDOCRINE:  Negative for cold or heat intolerance, polyuria, polydipsia and goiter. NEURO: negative for tremor, gait imbalance, syncope and seizures. The remainder of the review of systems is noncontributory.   Physical Exam: BP (!) 174/94 (BP Location: Left Arm, Patient Position: Sitting, Cuff Size: Normal)   Pulse (!) 55   Temp (!) 97.5 F (36.4 C) (Temporal)   Ht 5' 10 (1.778 m)   Wt 147 lb 8 oz (66.9 kg)   BMI 21.16 kg/m  GENERAL: The patient is AO x3, in no acute distress. HEENT: Head is normocephalic and atraumatic. EOMI are intact.  Mouth is well hydrated and without lesions. NECK: Supple. No masses LUNGS: Clear to auscultation. No presence of rhonchi/wheezing/rales. Adequate chest expansion HEART: RRR, normal s1 and s2. ABDOMEN: Soft, nontender, no guarding, no peritoneal signs, and nondistended. BS +. No masses. RECTAL EXAM: no external lesions, normal tone, no masses, brown stool without blood.*** Chaperone: EXTREMITIES: Without any cyanosis, clubbing, rash, lesions or edema. NEUROLOGIC: AOx3, no focal motor deficit. SKIN: no jaundice, no rashes  Imaging/Labs: as above  I personally reviewed and interpreted the available labs, imaging and endoscopic files.  Impression and Plan: [name] is a 89 y.o. male coming for follow up of ***   All questions were answered.      Roy Cress, MD Gastroenterology and Hepatology [facility] Gastroenterology

## 2024-06-18 DIAGNOSIS — L570 Actinic keratosis: Secondary | ICD-10-CM | POA: Diagnosis not present

## 2024-06-18 DIAGNOSIS — D692 Other nonthrombocytopenic purpura: Secondary | ICD-10-CM | POA: Diagnosis not present

## 2024-06-18 DIAGNOSIS — Z85828 Personal history of other malignant neoplasm of skin: Secondary | ICD-10-CM | POA: Diagnosis not present

## 2024-06-18 DIAGNOSIS — D1801 Hemangioma of skin and subcutaneous tissue: Secondary | ICD-10-CM | POA: Diagnosis not present

## 2024-06-18 DIAGNOSIS — L817 Pigmented purpuric dermatosis: Secondary | ICD-10-CM | POA: Diagnosis not present

## 2024-07-12 DIAGNOSIS — R945 Abnormal results of liver function studies: Secondary | ICD-10-CM | POA: Diagnosis not present

## 2024-07-12 DIAGNOSIS — I2510 Atherosclerotic heart disease of native coronary artery without angina pectoris: Secondary | ICD-10-CM | POA: Diagnosis not present

## 2024-07-12 DIAGNOSIS — Z79899 Other long term (current) drug therapy: Secondary | ICD-10-CM | POA: Diagnosis not present

## 2024-07-12 DIAGNOSIS — E875 Hyperkalemia: Secondary | ICD-10-CM | POA: Diagnosis not present

## 2024-07-16 ENCOUNTER — Telehealth: Payer: Self-pay

## 2024-07-16 NOTE — Telephone Encounter (Signed)
 Up to date on rosuvastatin , next review in 1 week

## 2024-07-19 ENCOUNTER — Other Ambulatory Visit (HOSPITAL_COMMUNITY): Payer: Self-pay | Admitting: Internal Medicine

## 2024-07-19 DIAGNOSIS — N1831 Chronic kidney disease, stage 3a: Secondary | ICD-10-CM | POA: Diagnosis not present

## 2024-07-19 DIAGNOSIS — Z79899 Other long term (current) drug therapy: Secondary | ICD-10-CM | POA: Diagnosis not present

## 2024-07-19 DIAGNOSIS — R413 Other amnesia: Secondary | ICD-10-CM | POA: Diagnosis not present

## 2024-07-19 DIAGNOSIS — E875 Hyperkalemia: Secondary | ICD-10-CM | POA: Diagnosis not present

## 2024-07-19 DIAGNOSIS — I2510 Atherosclerotic heart disease of native coronary artery without angina pectoris: Secondary | ICD-10-CM | POA: Diagnosis not present

## 2024-07-23 ENCOUNTER — Telehealth: Payer: Self-pay

## 2024-07-23 NOTE — Telephone Encounter (Signed)
 Up to date on rosuvastatin  fills, next review in August

## 2024-07-26 ENCOUNTER — Ambulatory Visit (HOSPITAL_COMMUNITY)
Admission: RE | Admit: 2024-07-26 | Discharge: 2024-07-26 | Disposition: A | Discharge status: Home / Self Care | Source: Ambulatory Visit | Attending: Internal Medicine | Admitting: Internal Medicine

## 2024-07-26 DIAGNOSIS — G319 Degenerative disease of nervous system, unspecified: Secondary | ICD-10-CM | POA: Diagnosis not present

## 2024-07-26 DIAGNOSIS — R413 Other amnesia: Secondary | ICD-10-CM | POA: Insufficient documentation

## 2024-08-09 DIAGNOSIS — G3184 Mild cognitive impairment, so stated: Secondary | ICD-10-CM | POA: Diagnosis not present

## 2024-08-09 DIAGNOSIS — Z8673 Personal history of transient ischemic attack (TIA), and cerebral infarction without residual deficits: Secondary | ICD-10-CM | POA: Diagnosis not present

## 2024-08-09 DIAGNOSIS — D519 Vitamin B12 deficiency anemia, unspecified: Secondary | ICD-10-CM | POA: Diagnosis not present

## 2024-09-09 ENCOUNTER — Ambulatory Visit (INDEPENDENT_AMBULATORY_CARE_PROVIDER_SITE_OTHER): Admitting: Gastroenterology

## 2024-09-09 ENCOUNTER — Encounter (INDEPENDENT_AMBULATORY_CARE_PROVIDER_SITE_OTHER): Payer: Self-pay | Admitting: Gastroenterology

## 2024-09-09 VITALS — BP 154/71 | HR 59 | Ht 70.0 in | Wt 145.2 lb

## 2024-09-09 DIAGNOSIS — R159 Full incontinence of feces: Secondary | ICD-10-CM

## 2024-09-09 DIAGNOSIS — R948 Abnormal results of function studies of other organs and systems: Secondary | ICD-10-CM | POA: Insufficient documentation

## 2024-09-09 DIAGNOSIS — K6289 Other specified diseases of anus and rectum: Secondary | ICD-10-CM | POA: Insufficient documentation

## 2024-09-09 NOTE — Patient Instructions (Signed)
 Please start metamucil twice daily, you can also try benefiber if you cannot tolerate metamucil You can use imodium as needed Please discuss aricept contributing to your fecal leakage, with Dr. Sheryle, though I suspect most of the cause of your issues is decreased rectal muscle tone, which we can see with aging  If you fail to improve with fiber, we may consider further testing with anorectal manometry to measure how significant the decreased muscle tone Is and send you to a pelvic floor physical therapist  Follow up 1-2 months  It was a pleasure to see you today. I want to create trusting relationships with patients and provide genuine, compassionate, and quality care. I truly value your feedback! please be on the lookout for a survey regarding your visit with me today. I appreciate your input about our visit and your time in completing this!    Magalie Almon L. Nazair Fortenberry, MSN, APRN, AGNP-C Adult-Gerontology Nurse Practitioner [facility] Gastroenterology at [facility]

## 2024-09-09 NOTE — Progress Notes (Addendum)
 Referring Provider: Sheryle Carwin, MD Primary Care Physician:  Sheryle Carwin, MD Primary GI Physician: Dr. Eartha   Chief Complaint  Patient presents with   Bowel Contol    Pt arrives due to unable to control bowels. Pt states when he goes to bathroom it is solid but when he stands up, he will have bowel come out. No abdominal pain. Pt states he has had this happen before. Episodes usually last about a week. Pt states he is has no warning when the episodes will happen. Has had TCS in the past.    HPI:   [name] is a 89 y.o. male with past medical history of coronary artery disease, fecal incontinence, myocardial infarction and coronary artery disease status post CABG   Patient presenting today for:  Fecal incontinence with history of abnormal anorectal manometry   Last seen June, at that time, taking omeprazole  40mg  daily with good control, sometimes food slower to pass. Having regular BMs, though sometimes hard to get completely clean  Last labs with TSH 2.91 in July CBC and CMP in march were unremarkable  Present: States about 1 week ago he started having fecal seepage/incontinence. He reports previous episodes similar in the distant past. He reports normal bowel movements. Usually 1-2 solid stools per day. He notes that he feels he is emptying out sufficiently. He reports that he has some difficulty cleaning himself up after defecation, noting some softer stool as he wipes. Denies rectal bleeding or melena. No abdominal pain. He does note he started aricept around the end of August. No other new medications. Denies any pain, itching, burning in his rectal area, no issues with hemorrhoids in the past that he is aware of. Denies any changes in appetite or weight loss.    US  abd complete 09/2023: . Mildly heterogeneous hepatic echogenicity is nonspecific. Previously described LEFT liver mass from prior MRI is not visualized sonographically. MRI Abd w wo contrast 11 2023:. Mildly  increased echogenicity of the bilateral kidneys as can be seen in medical renal disease  Mild intra- and extrahepatic biliary ductal dilatation, common bile duct measuring up to 0.8 cm in caliber. There is a 0.5 cm calculus in the common bile duct near the ampulla. 2. The gallbladder is mildly distended and contains tiny gallstones and or sludge. No gallbladder mass or suspicious gallbladder wall thickening. 3. 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 This could be followed at 6 months or sooner if warranted by the above risk factors. 4. Moderate hiatal hernia. 5. Cardiomegaly status post median sternotomy. 6. Large burden of stool in the colon. Last EGDfeb 2022 No endoscopic esophageal abnormality to explain patient's dysphagia. Esophagus dilated. Evidence of mucosal disruption - possible muscle bar in proximal esophagus. - 2 cm hiatal hernia. - A few gastric polyps. (Fundic gland polyps) - Normal examined duodenum.  LGI Anal EUS: 02/2021:  No sphincter tears. Slight thinning of the external anal sphincter  Consider biofeedback therapy   Anorectal manometry: 12/2020: The resting pressure of the IAS is normal tensive. The EAS is able to  generate adequate squeeze pressure and has a normal squeeze duration.  During strain, there is adequate  relaxation of the EAS and there is  adequate intrarectal pressure. The patient is able to expel a 50cc water   filled balloon during expulsion testing. The RAIR and cough reflexes are  normal. Sensation analysis revealed delayed sensation for first sensation  and early urge and discomfort sensation to balloon volume distention which  may indicate the presence of underlying hypersensitivity. 2D/3D analysis  revealed an area of  asymmetry in the left anterior position associated  with diminished resting rectal tone in that area. Would consider anal  ultrasound to further evaluate.   Last Colonoscopy: 2011 diverticula in sigmoid colon   Filed Weights   09/09/24 1020  Weight: 145 lb 3.2 oz (65.9 kg)     Past Medical History:  Diagnosis Date   Barrett's esophagus 01/25/2017   Chronic kidney disease    stage 3   Coronary artery disease    Diabetes mellitus without complication (HCC)    type 2, no meds, patient does  not know about this dx but on Dr Marily record   Dysphagia 01/25/2017   hx of   Fecal incontinence    small amount leakage   Foley catheter in place    changed Dec 31 2018   GERD (gastroesophageal reflux disease)    Hyperlipidemia    Myocardial infarction [facility]) 1996or 1997   Pneumonia    Urinary retention     Past Surgical History:  Procedure Laterality Date   BACK SURGERY     lower   BIOPSY  02/16/2021   Procedure: BIOPSY;  Surgeon: Eartha Angelia Sieving, MD;  [facility];  Service: Gastroenterology;;   BIOPSY  11/10/2022   Procedure: BIOPSY;  Surgeon: Wilhelmenia Aloha Raddle., MD;  Location: [facility];  Service: Gastroenterology;;   cataract surgery     bilateral   CHOLECYSTECTOMY N/A 01/04/2023   Procedure: LAPAROSCOPIC CHOLECYSTECTOMY WITH ICG DYE;  Surgeon: Ebbie Cough, MD;  [facility];  Service: General;  Laterality: N/A;   CORONARY ARTERY BYPASS GRAFT  1996   15 yrs ago x 5    ENDOSCOPIC RETROGRADE CHOLANGIOPANCREATOGRAPHY (ERCP) WITH PROPOFOL  N/A 11/10/2022   Procedure: ENDOSCOPIC RETROGRADE CHOLANGIOPANCREATOGRAPHY (ERCP) WITH PROPOFOL ;  Surgeon: Wilhelmenia Aloha Raddle., MD;  Location: [facility];  Service: Gastroenterology;  Laterality: N/A;   ESOPHAGEAL DILATION N/A 02/17/2017   Procedure: ESOPHAGEAL DILATION;  Surgeon: Claudis RAYMOND Rivet, MD;  [facility];  Service: Endoscopy;  Laterality: N/A;   ESOPHAGEAL DILATION N/A 02/16/2021    Procedure: ESOPHAGEAL DILATION;  Surgeon: Eartha Angelia Sieving, MD;  [facility];  Service: Gastroenterology;  Laterality: N/A;   ESOPHAGOGASTRODUODENOSCOPY N/A 02/17/2017   Procedure: ESOPHAGOGASTRODUODENOSCOPY (EGD);  Surgeon: Claudis RAYMOND Rivet, MD;  [facility];  Service: Endoscopy;  Laterality: N/A;  12:45   ESOPHAGOGASTRODUODENOSCOPY (EGD) WITH PROPOFOL  N/A 02/16/2021   Procedure: ESOPHAGOGASTRODUODENOSCOPY (EGD) WITH PROPOFOL ;  Surgeon: Eartha Angelia Sieving, MD;  [facility];  Service: Gastroenterology;  Laterality: N/A;  9:15   INGUINAL HERNIA REPAIR Right 01/04/2023   Procedure: RIGHT INGUINAL HERNIA REPAIR WITH MESH;  Surgeon: Ebbie Cough, MD;  Location: [facility] OR;  Service: General;  Laterality: Right;  GEN & TAP BLOCK   PANCREATIC STENT PLACEMENT  11/10/2022   Procedure: PANCREATIC STENT PLACEMENT;  Surgeon: Wilhelmenia Aloha Raddle., MD;  Location: [facility];  Service: Gastroenterology;;   POLYPECTOMY  02/16/2021   Procedure: POLYPECTOMY;  Surgeon: Eartha Angelia Sieving, MD;  [facility];  Service: Gastroenterology;;   REMOVAL OF STONES  11/10/2022   Procedure: REMOVAL OF STONES;  Surgeon: Wilhelmenia Aloha Raddle., MD;  Location: [facility];  Service: Gastroenterology;;   MARCELINE  CUFF REPAIR     Left   SPHINCTEROTOMY  11/10/2022   Procedure: SPHINCTEROTOMY;  Surgeon: Mansouraty, Aloha Raddle., MD;  Location: [facility];  Service: Gastroenterology;;   TRANSURETHRAL RESECTION OF PROSTATE N/A 02/06/2019   Procedure: TRANSURETHRAL RESECTION OF THE PROSTATE (TURP);  Surgeon: Cam Morene ORN, MD;  Location: [facility];  Service: Urology;  Laterality: N/A;    Current Outpatient Medications  Medication Sig Dispense Refill   acetaminophen  (TYLENOL ) 500 MG tablet Take 500-1,000 mg by mouth every 6 (six) hours as needed (pain).     aspirin  EC 81 MG tablet Take 81 mg by mouth daily.     atenolol  (TENORMIN ) 25 MG tablet Take  25 mg by mouth daily.     Cyanocobalamin  (B-12 PO) Take by mouth.     donepezil (ARICEPT) 5 MG tablet Take 5 mg by mouth daily.     Emollient (CETAPHIL) cream Apply 1 Application topically daily.     ezetimibe  (ZETIA ) 10 MG tablet Take 10 mg by mouth daily.     omeprazole  (PRILOSEC) 40 MG capsule Take 1 capsule (40 mg total) by mouth daily. 30 capsule 12   rosuvastatin  (CRESTOR ) 20 MG tablet Take 20 mg by mouth every Sunday.     sodium zirconium cyclosilicate (LOKELMA) 10 g PACK packet Take 10 g by mouth 3 (three) times a week.     No current facility-administered medications for this visit.   Facility-Administered Medications Ordered in Other Visits  Medication Dose Route Frequency Provider Last Rate Last Admin   Spy Agent Green / Firefly Optime  1.25 mg Intravenous Once Ebbie Cough, MD        Allergies as of 09/09/2024   (No Known Allergies)    Social History   Socioeconomic History   Marital status: Married    Spouse name: Not on file   Number of children: Not on file   Years of education: Not on file   Highest education level: Not on file  Occupational History   Not on file  Tobacco Use   Smoking status: Former    Passive exposure: Never   Smokeless tobacco: Former    Quit date: 1995   Tobacco comments:    quit more than 20 years ago  Vaping Use   Vaping status: Never Used  Substance and Sexual Activity   Alcohol use: No   Drug use: No   Sexual activity: Not Currently  Other Topics Concern   Not on file  Social History Narrative   Not on file   Social Drivers of Health   Financial Resource Strain: Not on file  Food Insecurity: No Food Insecurity (01/04/2023)   Hunger Vital Sign    Worried About Running Out of Food in the Last Year: Never true    Ran Out of Food in the Last Year: Never true  Transportation Needs: No Transportation Needs (01/04/2023)   PRAPARE - Administrator, Civil Service (Medical): No    Lack of Transportation  (Non-Medical): No  Physical Activity: Not on file  Stress: Not on file  Social Connections: Not on file    Review of systems General: negative for malaise, night sweats, fever, chills, weight loss Neck: Negative for lumps, goiter, pain and significant neck swelling Resp: Negative for cough, wheezing, dyspnea at rest CV: Negative for chest pain, leg swelling, palpitations, orthopnea GI: denies melena, hematochezia, nausea, vomiting, diarrhea, constipation, dysphagia, odyonophagia, early satiety or unintentional weight loss. +fecal incontinence  MSK: Negative for joint pain  or swelling, back pain, and muscle pain. Derm: Negative for itching or rash Psych: Denies depression, anxiety, memory loss, confusion. No homicidal or suicidal ideation.  Heme: Negative for prolonged bleeding, bruising easily, and swollen nodes. Endocrine: Negative for cold or heat intolerance, polyuria, polydipsia and goiter. Neuro: negative for tremor, gait imbalance, syncope and seizures. The remainder of the review of systems is noncontributory.  Physical Exam: BP (!) 169/70   Pulse (!) 52   Ht 5' 10 (1.778 m)   Wt 145 lb 3.2 oz (65.9 kg)   BMI 20.83 kg/m  General:   Alert and oriented. No distress noted. Pleasant and cooperative.  Head:  Normocephalic and atraumatic. Eyes:  Conjuctiva clear without scleral icterus. Mouth:  Oral mucosa pink and moist. Good dentition. No lesions. Heart: Normal rate and rhythm, s1 and s2 heart sounds present.  Lungs: Clear lung sounds in all lobes. Respirations equal and unlabored. Abdomen:  +BS, soft, non-tender and non-distended. No rebound or guarding. No HSM or masses noted. Rectal: Glenys bruns, LPN present as witness. Sphincter tone very hypotonic on exam, even with patient bearing down, very reduced tone felt on DRE. No lesions or masses noted Derm: No palmar erythema or jaundice Msk:  Symmetrical without gross deformities. Normal posture. Extremities:  Without  edema. Neurologic:  Alert and  oriented x4 Psych:  Alert and cooperative. Normal mood and affect.  Invalid input(s): 6 MONTHS   ASSESSMENT: [name] is a 89 y.o. male presenting today for fecal incontinence  Patient with history of fecal incontinence in the past. He had anorectal manometry and anorectal EUS in 2022 which showed thickening of one of the layers of muscle, he was recommended to have biofeedback treatment per motility experts at [facility], however, he was unable to travel to [facility] and was not interested in pursuing this. He notes no issues with incontinence up until recently and started to have more episodes like this, despite having solid stools when defecating in the restroom. He denies any rectal bleeding, melena or abdominal pain. 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 We will start with adding fiber to help bulk the stools, I also recommended patient discuss aricept potentially contributing to his symptoms with PCP. If he fails to have improvement with use of bulking agent, we discussed referral to pelvic floor physical therapy as we have access to more local therapists now so he would not have to travel to [place] for this.   PLAN:  -start metamucil BID -discuss possible side effect of aricept with PCP -consider referral to pelvic floor physical therapist if symptoms do not improve with fiber  All questions were answered, patient verbalized understanding and is in agreement with plan as outlined above.   Follow Up: 1 month  Quynn Vilchis L. Mariette, MSN, APRN, AGNP-C Adult-Gerontology Nurse Practitioner [facility]  I have reviewed the note and agree with the APP's assessment as described in this progress note  Toribio Fortune, MD Gastroenterology and  Hepatology [facility] Gastroenterology

## 2024-09-23 DIAGNOSIS — Z23 Encounter for immunization: Secondary | ICD-10-CM | POA: Diagnosis not present

## 2024-09-26 ENCOUNTER — Ambulatory Visit (INDEPENDENT_AMBULATORY_CARE_PROVIDER_SITE_OTHER): Admitting: Gastroenterology

## 2024-10-01 DIAGNOSIS — Z85828 Personal history of other malignant neoplasm of skin: Secondary | ICD-10-CM | POA: Diagnosis not present

## 2024-10-01 DIAGNOSIS — L570 Actinic keratosis: Secondary | ICD-10-CM | POA: Diagnosis not present

## 2024-10-01 DIAGNOSIS — L218 Other seborrheic dermatitis: Secondary | ICD-10-CM | POA: Diagnosis not present

## 2024-10-01 DIAGNOSIS — L720 Epidermal cyst: Secondary | ICD-10-CM | POA: Diagnosis not present

## 2024-10-01 DIAGNOSIS — D2271 Melanocytic nevi of right lower limb, including hip: Secondary | ICD-10-CM | POA: Diagnosis not present

## 2024-10-09 ENCOUNTER — Encounter (INDEPENDENT_AMBULATORY_CARE_PROVIDER_SITE_OTHER): Payer: Self-pay | Admitting: Gastroenterology

## 2024-10-22 ENCOUNTER — Ambulatory Visit (INDEPENDENT_AMBULATORY_CARE_PROVIDER_SITE_OTHER): Admitting: Gastroenterology

## 2024-11-01 DIAGNOSIS — I2510 Atherosclerotic heart disease of native coronary artery without angina pectoris: Secondary | ICD-10-CM | POA: Diagnosis not present

## 2024-11-01 DIAGNOSIS — Z79899 Other long term (current) drug therapy: Secondary | ICD-10-CM | POA: Diagnosis not present

## 2024-11-08 DIAGNOSIS — G3184 Mild cognitive impairment, so stated: Secondary | ICD-10-CM | POA: Diagnosis not present

## 2024-11-08 DIAGNOSIS — Z8673 Personal history of transient ischemic attack (TIA), and cerebral infarction without residual deficits: Secondary | ICD-10-CM | POA: Diagnosis not present

## 2024-11-08 DIAGNOSIS — D519 Vitamin B12 deficiency anemia, unspecified: Secondary | ICD-10-CM | POA: Diagnosis not present

## 2024-11-12 DIAGNOSIS — N400 Enlarged prostate without lower urinary tract symptoms: Secondary | ICD-10-CM | POA: Diagnosis not present

## 2024-11-12 DIAGNOSIS — N401 Enlarged prostate with lower urinary tract symptoms: Secondary | ICD-10-CM | POA: Diagnosis not present

## 2024-11-12 DIAGNOSIS — R338 Other retention of urine: Secondary | ICD-10-CM | POA: Diagnosis not present

## 2024-11-12 DIAGNOSIS — R3914 Feeling of incomplete bladder emptying: Secondary | ICD-10-CM | POA: Diagnosis not present

## 2024-11-12 DIAGNOSIS — R310 Gross hematuria: Secondary | ICD-10-CM | POA: Diagnosis not present
